# Patient Record
Sex: Female | Born: 1960 | Race: White | Hispanic: No | Marital: Married | Smoking: Never smoker
Health system: Southern US, Community
[De-identification: ages and names within clinical notes are randomized; demographics above are authoritative.]

## PROBLEM LIST (undated history)

## (undated) DIAGNOSIS — Z9289 Personal history of other medical treatment: Secondary | ICD-10-CM

## (undated) DIAGNOSIS — F329 Major depressive disorder, single episode, unspecified: Secondary | ICD-10-CM

## (undated) DIAGNOSIS — K1120 Sialoadenitis, unspecified: Secondary | ICD-10-CM

## (undated) DIAGNOSIS — E785 Hyperlipidemia, unspecified: Secondary | ICD-10-CM

## (undated) DIAGNOSIS — J45909 Unspecified asthma, uncomplicated: Secondary | ICD-10-CM

## (undated) DIAGNOSIS — I10 Essential (primary) hypertension: Secondary | ICD-10-CM

## (undated) DIAGNOSIS — T7840XA Allergy, unspecified, initial encounter: Secondary | ICD-10-CM

## (undated) DIAGNOSIS — G40909 Epilepsy, unspecified, not intractable, without status epilepticus: Secondary | ICD-10-CM

## (undated) DIAGNOSIS — R569 Unspecified convulsions: Secondary | ICD-10-CM

## (undated) DIAGNOSIS — K219 Gastro-esophageal reflux disease without esophagitis: Secondary | ICD-10-CM

## (undated) DIAGNOSIS — C439 Malignant melanoma of skin, unspecified: Secondary | ICD-10-CM

## (undated) DIAGNOSIS — F32A Depression, unspecified: Secondary | ICD-10-CM

## (undated) DIAGNOSIS — E119 Type 2 diabetes mellitus without complications: Secondary | ICD-10-CM

## (undated) DIAGNOSIS — K760 Fatty (change of) liver, not elsewhere classified: Secondary | ICD-10-CM

## (undated) DIAGNOSIS — F419 Anxiety disorder, unspecified: Secondary | ICD-10-CM

## (undated) DIAGNOSIS — E039 Hypothyroidism, unspecified: Secondary | ICD-10-CM

## (undated) DIAGNOSIS — G4730 Sleep apnea, unspecified: Secondary | ICD-10-CM

## (undated) HISTORY — DX: Major depressive disorder, single episode, unspecified: F32.9

## (undated) HISTORY — DX: Depression, unspecified: F32.A

## (undated) HISTORY — DX: Type 2 diabetes mellitus without complications: E11.9

## (undated) HISTORY — PX: LAPAROSCOPIC LYSIS INTESTINAL ADHESIONS: SUR778

## (undated) HISTORY — DX: Unspecified convulsions: R56.9

## (undated) HISTORY — DX: Malignant melanoma of skin, unspecified: C43.9

## (undated) HISTORY — PX: OTHER SURGICAL HISTORY: SHX169

## (undated) HISTORY — DX: Personal history of other medical treatment: Z92.89

## (undated) HISTORY — DX: Essential (primary) hypertension: I10

## (undated) HISTORY — DX: Sialoadenitis, unspecified: K11.20

## (undated) HISTORY — DX: Sleep apnea, unspecified: G47.30

## (undated) HISTORY — DX: Epilepsy, unspecified, not intractable, without status epilepticus: G40.909

## (undated) HISTORY — DX: Hypothyroidism, unspecified: E03.9

## (undated) HISTORY — DX: Unspecified asthma, uncomplicated: J45.909

## (undated) HISTORY — DX: Hyperlipidemia, unspecified: E78.5

## (undated) HISTORY — DX: Gastro-esophageal reflux disease without esophagitis: K21.9

## (undated) HISTORY — DX: Anxiety disorder, unspecified: F41.9

## (undated) HISTORY — DX: Allergy, unspecified, initial encounter: T78.40XA

---

## 1993-09-17 HISTORY — PX: CHOLECYSTECTOMY: SHX55

## 1993-09-17 HISTORY — PX: BILE DUCT EXPLORATION: SHX1225

## 1996-09-17 HISTORY — PX: ABDOMINAL HYSTERECTOMY: SHX81

## 1996-09-17 HISTORY — PX: TUBAL LIGATION: SHX77

## 1997-09-17 HISTORY — PX: LAPAROSCOPIC OOPHERECTOMY: SHX6507

## 2003-06-10 ENCOUNTER — Ambulatory Visit (HOSPITAL_COMMUNITY): Admission: RE | Admit: 2003-06-10 | Discharge: 2003-06-10 | Payer: Self-pay | Admitting: Internal Medicine

## 2003-06-10 ENCOUNTER — Encounter: Payer: Self-pay | Admitting: Internal Medicine

## 2005-07-10 ENCOUNTER — Ambulatory Visit: Payer: Self-pay | Admitting: Internal Medicine

## 2005-09-25 ENCOUNTER — Ambulatory Visit: Payer: Self-pay | Admitting: Internal Medicine

## 2005-09-25 ENCOUNTER — Ambulatory Visit (HOSPITAL_COMMUNITY): Admission: RE | Admit: 2005-09-25 | Discharge: 2005-09-25 | Payer: Self-pay | Admitting: Internal Medicine

## 2005-10-02 ENCOUNTER — Ambulatory Visit: Payer: Self-pay | Admitting: Gastroenterology

## 2005-10-08 ENCOUNTER — Encounter (INDEPENDENT_AMBULATORY_CARE_PROVIDER_SITE_OTHER): Payer: Self-pay | Admitting: *Deleted

## 2005-10-08 ENCOUNTER — Ambulatory Visit: Payer: Self-pay | Admitting: Gastroenterology

## 2005-10-24 ENCOUNTER — Ambulatory Visit: Payer: Self-pay | Admitting: Internal Medicine

## 2005-10-30 ENCOUNTER — Ambulatory Visit: Payer: Self-pay | Admitting: Gastroenterology

## 2005-11-26 ENCOUNTER — Ambulatory Visit: Payer: Self-pay | Admitting: Internal Medicine

## 2005-11-26 ENCOUNTER — Ambulatory Visit: Payer: Self-pay | Admitting: Cardiovascular Disease

## 2005-11-26 ENCOUNTER — Inpatient Hospital Stay (HOSPITAL_COMMUNITY): Admission: EM | Admit: 2005-11-26 | Discharge: 2005-11-27 | Payer: Self-pay | Admitting: Emergency Medicine

## 2005-12-04 ENCOUNTER — Ambulatory Visit: Payer: Self-pay

## 2005-12-04 ENCOUNTER — Ambulatory Visit: Payer: Self-pay | Admitting: Gastroenterology

## 2006-04-02 ENCOUNTER — Ambulatory Visit: Payer: Self-pay | Admitting: Internal Medicine

## 2006-11-12 ENCOUNTER — Ambulatory Visit: Payer: Self-pay | Admitting: Internal Medicine

## 2007-01-08 ENCOUNTER — Ambulatory Visit: Payer: Self-pay | Admitting: Internal Medicine

## 2007-01-08 ENCOUNTER — Ambulatory Visit: Payer: Self-pay | Admitting: Family Medicine

## 2007-01-08 LAB — CONVERTED CEMR LAB
BUN: 10 mg/dL (ref 6–23)
CO2: 31 meq/L (ref 19–32)
Calcium: 9.5 mg/dL (ref 8.4–10.5)
Creatinine, Ser: 0.8 mg/dL (ref 0.4–1.2)
GFR calc non Af Amer: 83 mL/min
Glucose, Bld: 101 mg/dL — ABNORMAL HIGH (ref 70–99)
Sodium: 140 meq/L (ref 135–145)

## 2007-02-06 ENCOUNTER — Ambulatory Visit: Payer: Self-pay | Admitting: Pulmonary Disease

## 2007-03-04 ENCOUNTER — Ambulatory Visit (HOSPITAL_BASED_OUTPATIENT_CLINIC_OR_DEPARTMENT_OTHER): Admission: RE | Admit: 2007-03-04 | Discharge: 2007-03-04 | Payer: Self-pay | Admitting: Pulmonary Disease

## 2007-03-17 ENCOUNTER — Ambulatory Visit: Payer: Self-pay | Admitting: Pulmonary Disease

## 2007-03-19 ENCOUNTER — Ambulatory Visit: Payer: Self-pay | Admitting: Pulmonary Disease

## 2007-05-22 ENCOUNTER — Encounter: Payer: Self-pay | Admitting: Internal Medicine

## 2007-05-22 DIAGNOSIS — F3289 Other specified depressive episodes: Secondary | ICD-10-CM | POA: Insufficient documentation

## 2007-05-22 DIAGNOSIS — R569 Unspecified convulsions: Secondary | ICD-10-CM | POA: Insufficient documentation

## 2007-05-22 DIAGNOSIS — F1010 Alcohol abuse, uncomplicated: Secondary | ICD-10-CM | POA: Insufficient documentation

## 2007-05-22 DIAGNOSIS — F39 Unspecified mood [affective] disorder: Secondary | ICD-10-CM | POA: Insufficient documentation

## 2007-05-22 DIAGNOSIS — J309 Allergic rhinitis, unspecified: Secondary | ICD-10-CM | POA: Insufficient documentation

## 2007-05-22 DIAGNOSIS — F329 Major depressive disorder, single episode, unspecified: Secondary | ICD-10-CM

## 2007-05-22 DIAGNOSIS — J45909 Unspecified asthma, uncomplicated: Secondary | ICD-10-CM | POA: Insufficient documentation

## 2007-05-22 DIAGNOSIS — J3089 Other allergic rhinitis: Secondary | ICD-10-CM | POA: Insufficient documentation

## 2007-05-26 DIAGNOSIS — D649 Anemia, unspecified: Secondary | ICD-10-CM | POA: Insufficient documentation

## 2007-06-05 ENCOUNTER — Ambulatory Visit: Payer: Self-pay | Admitting: Pulmonary Disease

## 2007-06-16 ENCOUNTER — Ambulatory Visit: Payer: Self-pay | Admitting: Internal Medicine

## 2007-06-16 LAB — CONVERTED CEMR LAB
Basophils Absolute: 0.1 10*3/uL (ref 0.0–0.1)
Basophils Relative: 0.4 % (ref 0.0–1.0)
Calcium: 9.6 mg/dL (ref 8.4–10.5)
Chloride: 106 meq/L (ref 96–112)
Direct LDL: 108 mg/dL
Eosinophils Relative: 4.2 % (ref 0.0–5.0)
GFR calc Af Amer: 117 mL/min
GFR calc non Af Amer: 97 mL/min
HDL: 33 mg/dL — ABNORMAL LOW (ref >39.0)
Hemoglobin: 11.5 g/dL — ABNORMAL LOW (ref 12.0–15.0)
Lymphocytes Relative: 34.1 % (ref 12.0–46.0)
MCHC: 34.1 g/dL (ref 30.0–36.0)
Monocytes Absolute: 0.5 10*3/uL (ref 0.2–0.7)
Neutrophils Relative %: 55.6 % (ref 43.0–77.0)
Platelets: 303 10*3/uL (ref 150–400)
Potassium: 4.5 meq/L (ref 3.5–5.1)
RDW: 11.5 % (ref 11.5–14.6)
Sodium: 142 meq/L (ref 135–145)
TSH: 3.84 microintl units/mL (ref 0.35–5.50)
Total Bilirubin: 0.6 mg/dL (ref 0.3–1.2)
Total CHOL/HDL Ratio: 5
Total Protein: 6.3 g/dL (ref 6.0–8.3)
Triglycerides: 215 mg/dL (ref 0–149)
Urine Glucose: NEGATIVE mg/dL
VLDL: 43 mg/dL — ABNORMAL HIGH (ref 0–40)
WBC: 9.1 10*3/uL (ref 4.5–10.5)

## 2007-08-08 ENCOUNTER — Ambulatory Visit: Payer: Self-pay | Admitting: Gastroenterology

## 2007-08-11 ENCOUNTER — Ambulatory Visit: Payer: Self-pay | Admitting: Gastroenterology

## 2007-08-11 ENCOUNTER — Encounter: Payer: Self-pay | Admitting: Gastroenterology

## 2007-08-25 ENCOUNTER — Ambulatory Visit: Payer: Self-pay | Admitting: Internal Medicine

## 2007-08-25 DIAGNOSIS — E785 Hyperlipidemia, unspecified: Secondary | ICD-10-CM

## 2007-08-25 DIAGNOSIS — R7309 Other abnormal glucose: Secondary | ICD-10-CM | POA: Insufficient documentation

## 2007-08-25 DIAGNOSIS — E1169 Type 2 diabetes mellitus with other specified complication: Secondary | ICD-10-CM | POA: Insufficient documentation

## 2007-09-19 ENCOUNTER — Ambulatory Visit: Payer: Self-pay | Admitting: Internal Medicine

## 2007-09-19 DIAGNOSIS — D239 Other benign neoplasm of skin, unspecified: Secondary | ICD-10-CM | POA: Insufficient documentation

## 2007-09-19 DIAGNOSIS — I10 Essential (primary) hypertension: Secondary | ICD-10-CM

## 2007-09-19 DIAGNOSIS — I152 Hypertension secondary to endocrine disorders: Secondary | ICD-10-CM | POA: Insufficient documentation

## 2007-09-19 DIAGNOSIS — E1159 Type 2 diabetes mellitus with other circulatory complications: Secondary | ICD-10-CM

## 2007-09-30 ENCOUNTER — Ambulatory Visit: Payer: Self-pay | Admitting: Internal Medicine

## 2007-09-30 LAB — CONVERTED CEMR LAB
ALT: 27 units/L (ref 0–35)
AST: 21 units/L (ref 0–37)
Cholesterol: 198 mg/dL (ref 0–200)
Folate: >20 ng/mL
Iron: 68 ug/dL (ref 42–145)
Total CHOL/HDL Ratio: 5
Transferrin: 243.7 mg/dL (ref >=212.0)
Vitamin B-12: 551 pg/mL (ref 211–911)

## 2007-10-06 LAB — CONVERTED CEMR LAB

## 2007-10-13 ENCOUNTER — Encounter: Admission: RE | Admit: 2007-10-13 | Discharge: 2007-10-13 | Payer: Self-pay | Admitting: Obstetrics and Gynecology

## 2007-10-13 ENCOUNTER — Encounter: Payer: Self-pay | Admitting: Internal Medicine

## 2007-10-27 ENCOUNTER — Ambulatory Visit: Payer: Self-pay | Admitting: Internal Medicine

## 2007-10-27 DIAGNOSIS — F4322 Adjustment disorder with anxiety: Secondary | ICD-10-CM | POA: Insufficient documentation

## 2007-11-03 ENCOUNTER — Ambulatory Visit: Payer: Self-pay | Admitting: Internal Medicine

## 2007-11-03 ENCOUNTER — Telehealth: Payer: Self-pay | Admitting: Internal Medicine

## 2007-11-03 ENCOUNTER — Ambulatory Visit: Payer: Self-pay | Admitting: Cardiovascular Disease

## 2007-11-03 DIAGNOSIS — K1120 Sialoadenitis, unspecified: Secondary | ICD-10-CM | POA: Insufficient documentation

## 2007-11-05 ENCOUNTER — Ambulatory Visit: Payer: Self-pay | Admitting: Internal Medicine

## 2007-11-05 DIAGNOSIS — C792 Secondary malignant neoplasm of skin: Secondary | ICD-10-CM | POA: Insufficient documentation

## 2007-11-05 DIAGNOSIS — R55 Syncope and collapse: Secondary | ICD-10-CM | POA: Insufficient documentation

## 2007-11-06 ENCOUNTER — Encounter: Payer: Self-pay | Admitting: Internal Medicine

## 2007-11-06 LAB — CONVERTED CEMR LAB
BUN: 9 mg/dL (ref 6–23)
Basophils Absolute: 0.1 10*3/uL (ref 0.0–0.1)
CO2: 32 meq/L (ref 19–32)
Chloride: 101 meq/L (ref 96–112)
Eosinophils Absolute: 0.4 10*3/uL (ref 0.0–0.6)
Eosinophils Relative: 3.2 % (ref 0.0–5.0)
GFR calc Af Amer: 100 mL/min
Glucose, Bld: 95 mg/dL (ref 70–99)
Hemoglobin: 12.6 g/dL (ref 12.0–15.0)
MCHC: 34 g/dL (ref 30.0–36.0)
Neutro Abs: 7.3 10*3/uL (ref 1.4–7.7)
Neutrophils Relative %: 64.8 % (ref 43.0–77.0)
Platelets: 292 10*3/uL (ref 150–400)
Potassium: 4 meq/L (ref 3.5–5.1)
RBC: 4.09 M/uL (ref 3.87–5.11)

## 2007-11-20 ENCOUNTER — Ambulatory Visit: Payer: Self-pay | Admitting: Internal Medicine

## 2007-12-02 ENCOUNTER — Telehealth: Payer: Self-pay | Admitting: Internal Medicine

## 2007-12-19 ENCOUNTER — Ambulatory Visit (HOSPITAL_BASED_OUTPATIENT_CLINIC_OR_DEPARTMENT_OTHER): Admission: RE | Admit: 2007-12-19 | Discharge: 2007-12-19 | Payer: Self-pay | Admitting: Otolaryngology

## 2008-02-19 ENCOUNTER — Emergency Department (HOSPITAL_COMMUNITY): Admission: EM | Admit: 2008-02-19 | Discharge: 2008-02-20 | Payer: Self-pay | Admitting: Emergency Medicine

## 2008-03-25 ENCOUNTER — Ambulatory Visit: Payer: Self-pay | Admitting: Internal Medicine

## 2008-03-25 LAB — CONVERTED CEMR LAB
ALT: 19 units/L (ref 0–35)
Cholesterol: 125 mg/dL (ref 0–200)
HDL: 38.6 mg/dL — ABNORMAL LOW (ref >39.0)
LDL Cholesterol: 59 mg/dL (ref 0–99)

## 2008-04-23 ENCOUNTER — Ambulatory Visit: Payer: Self-pay | Admitting: Internal Medicine

## 2008-05-12 ENCOUNTER — Encounter: Payer: Self-pay | Admitting: Internal Medicine

## 2008-05-14 ENCOUNTER — Encounter: Payer: Self-pay | Admitting: Internal Medicine

## 2008-05-18 ENCOUNTER — Encounter: Payer: Self-pay | Admitting: Internal Medicine

## 2008-07-27 ENCOUNTER — Telehealth (INDEPENDENT_AMBULATORY_CARE_PROVIDER_SITE_OTHER): Payer: Self-pay | Admitting: *Deleted

## 2008-11-05 ENCOUNTER — Encounter: Admission: RE | Admit: 2008-11-05 | Discharge: 2008-11-05 | Payer: Self-pay | Admitting: Internal Medicine

## 2008-11-09 ENCOUNTER — Telehealth (INDEPENDENT_AMBULATORY_CARE_PROVIDER_SITE_OTHER): Payer: Self-pay | Admitting: *Deleted

## 2008-12-02 ENCOUNTER — Ambulatory Visit: Payer: Self-pay | Admitting: Internal Medicine

## 2008-12-02 LAB — CONVERTED CEMR LAB
BUN: 13 mg/dL (ref 6–23)
CO2: 32 meq/L (ref 19–32)
Chloride: 102 meq/L (ref 96–112)
Creatinine, Ser: 0.8 mg/dL (ref 0.4–1.2)
GFR calc non Af Amer: 82.3 mL/min (ref >60)

## 2008-12-06 ENCOUNTER — Ambulatory Visit: Payer: Self-pay | Admitting: Internal Medicine

## 2008-12-06 LAB — CONVERTED CEMR LAB: Hgb A1c MFr Bld: 6.2 % (ref 4.6–6.5)

## 2008-12-07 ENCOUNTER — Telehealth (INDEPENDENT_AMBULATORY_CARE_PROVIDER_SITE_OTHER): Payer: Self-pay | Admitting: *Deleted

## 2008-12-27 ENCOUNTER — Ambulatory Visit: Payer: Self-pay | Admitting: Internal Medicine

## 2008-12-28 ENCOUNTER — Encounter: Payer: Self-pay | Admitting: Internal Medicine

## 2009-01-06 ENCOUNTER — Encounter: Payer: Self-pay | Admitting: Internal Medicine

## 2009-01-10 ENCOUNTER — Encounter: Payer: Self-pay | Admitting: Internal Medicine

## 2009-01-10 ENCOUNTER — Telehealth: Payer: Self-pay | Admitting: Internal Medicine

## 2009-02-07 ENCOUNTER — Encounter: Payer: Self-pay | Admitting: Internal Medicine

## 2009-02-07 ENCOUNTER — Ambulatory Visit: Payer: Self-pay | Admitting: Internal Medicine

## 2009-03-15 ENCOUNTER — Ambulatory Visit: Payer: Self-pay | Admitting: Internal Medicine

## 2009-06-09 ENCOUNTER — Telehealth: Payer: Self-pay | Admitting: Internal Medicine

## 2009-06-13 ENCOUNTER — Telehealth: Payer: Self-pay | Admitting: Internal Medicine

## 2009-06-20 ENCOUNTER — Telehealth: Payer: Self-pay | Admitting: Internal Medicine

## 2009-06-20 ENCOUNTER — Encounter: Payer: Self-pay | Admitting: Internal Medicine

## 2009-06-21 ENCOUNTER — Encounter: Payer: Self-pay | Admitting: Internal Medicine

## 2009-07-01 ENCOUNTER — Encounter: Payer: Self-pay | Admitting: Internal Medicine

## 2009-07-06 ENCOUNTER — Encounter: Payer: Self-pay | Admitting: Internal Medicine

## 2009-08-02 ENCOUNTER — Ambulatory Visit: Payer: Self-pay | Admitting: Internal Medicine

## 2009-08-02 LAB — CONVERTED CEMR LAB
BUN: 10 mg/dL (ref 6–23)
CO2: 31 meq/L (ref 19–32)
Calcium: 9 mg/dL (ref 8.4–10.5)
Creatinine, Ser: 0.8 mg/dL (ref 0.4–1.2)
GFR calc non Af Amer: 82.06 mL/min (ref >60)
Potassium: 3.7 meq/L (ref 3.5–5.1)

## 2009-10-17 ENCOUNTER — Telehealth: Payer: Self-pay | Admitting: Internal Medicine

## 2009-12-15 ENCOUNTER — Telehealth: Payer: Self-pay | Admitting: Internal Medicine

## 2010-01-02 ENCOUNTER — Ambulatory Visit: Payer: Self-pay | Admitting: Internal Medicine

## 2010-01-02 DIAGNOSIS — G4700 Insomnia, unspecified: Secondary | ICD-10-CM | POA: Insufficient documentation

## 2010-01-02 DIAGNOSIS — R609 Edema, unspecified: Secondary | ICD-10-CM | POA: Insufficient documentation

## 2010-01-02 LAB — CONVERTED CEMR LAB
ALT: 12 units/L (ref 0–35)
BUN: 13 mg/dL (ref 6–23)
CO2: 26 meq/L (ref 19–32)
Calcium: 9.7 mg/dL (ref 8.4–10.5)
Cholesterol: 125 mg/dL (ref 0–200)
Creatinine, Ser: 0.79 mg/dL (ref 0.40–1.20)
HDL: 49 mg/dL (ref >39)
Hgb A1c MFr Bld: 6 % — ABNORMAL HIGH (ref <5.7)
Pro B Natriuretic peptide (BNP): 4.2 pg/mL (ref 0.0–100.0)
Total CHOL/HDL Ratio: 2.6
Triglycerides: 118 mg/dL (ref <150)

## 2010-01-03 ENCOUNTER — Encounter: Payer: Self-pay | Admitting: Internal Medicine

## 2010-01-30 ENCOUNTER — Encounter: Payer: Self-pay | Admitting: Internal Medicine

## 2010-03-17 ENCOUNTER — Telehealth: Payer: Self-pay | Admitting: Internal Medicine

## 2010-03-22 ENCOUNTER — Encounter: Admission: RE | Admit: 2010-03-22 | Discharge: 2010-03-22 | Payer: Self-pay | Admitting: Internal Medicine

## 2010-03-27 ENCOUNTER — Encounter: Payer: Self-pay | Admitting: Internal Medicine

## 2010-07-21 ENCOUNTER — Telehealth: Payer: Self-pay | Admitting: Internal Medicine

## 2010-09-08 ENCOUNTER — Ambulatory Visit: Payer: Self-pay | Admitting: Internal Medicine

## 2010-09-20 ENCOUNTER — Telehealth: Payer: Self-pay | Admitting: Internal Medicine

## 2010-10-15 LAB — CONVERTED CEMR LAB
Calcium, Total (PTH): 9.9 mg/dL (ref 8.4–10.5)
PTH: 35.4 pg/mL (ref 14.0–72.0)

## 2010-10-17 NOTE — Progress Notes (Signed)
Summary: Lunesta  Phone Note Refill Request Message from:  Fax from Pharmacy on December 15, 2009 2:35 PM  Refills Requested: Medication #1:  LUNESTA 2 MG  TABS one by mouth at bedtime prn   Dosage confirmed as above?Dosage Confirmed   Brand Name Necessary? No   Supply Requested: 1 month [facility]  [address] FAX [phone]   Method Requested: Electronic Next Appointment Scheduled: NONE Initial call taken by: Roselle Locus,  December 15, 2009 2:36 PM  Follow-up for Phone Call        FYI- patient has cancelled last 7 office visits Follow-up by: Glendell Docker CMA,  December 15, 2009 2:56 PM  Additional Follow-up for Phone Call Additional follow up Details #1::        refill x 1 only.  please let her know she needs to make f/u appt Additional Follow-up by: D. Thomos Lemons DO,  December 16, 2009 8:16 AM    Additional Follow-up for Phone Call Additional follow up Details #2::    Rx called to pharmacy, attempted to contact patient at [phone], voice recording stating the number has been disconnected, call placed to patient at [phone], no answer, detailed voice message left informing patient per Dr Artist Pais instructions Follow-up by: Glendell Docker CMA,  December 16, 2009 1:46 PM  New/Updated Medications: LUNESTA 2 MG  TABS (ESZOPICLONE) Take 1 tab by mouth at bedtime as needed (OFFICE VISIT IS NEEDED FOR REFILLS) Prescriptions: LUNESTA 2 MG  TABS (ESZOPICLONE) Take 1 tab by mouth at bedtime as needed (OFFICE VISIT IS NEEDED FOR REFILLS)  #30 x 0   Entered by:   Glendell Docker CMA   Authorized by:   D. Thomos Lemons DO   Signed by:   Glendell Docker CMA on 12/16/2009   Method used:   Telephoned to ...       [facility]  [address] [phone]* (retail)       [address]       [place], [place]  [address]       Ph: [phone]       Fax: [phone]   RxID:   [id number]

## 2010-10-17 NOTE — Progress Notes (Signed)
Summary: [facility] Refills  Phone Note Call from Patient Call back at [facility] Phone [phone]   Caller: Patient Reason for Call: Refill Medication Summary of Call: patient sent fax requesting new rx's sent to [facility] for her medications. The fax requests states she has new insurance that will become effective 03/17/2010. Initial call taken by: Glendell Docker CMA,  March 17, 2010 9:09 AM  Follow-up for Phone Call        Rx sent to [facility]  Follow-up by: Glendell Docker CMA,  March 17, 2010 9:13 AM    Prescriptions: SINGULAIR 10 MG  TABS (MONTELUKAST SODIUM) Take 1 tablet by mouth once a day as needed  #90 x 2   Entered by:   Glendell Docker CMA   Authorized by:   D. Thomos Lemons DO   Signed by:   Glendell Docker CMA on 03/17/2010   Method used:   Faxed to ...       [facility] (mail-order)             , [place]         Ph: [phone]       Fax: [phone]   RxID:   [id number] METFORMIN HCL 500 MG XR24H-TAB (METFORMIN HCL) one by mouth two times a day  #180 x 2   Entered by:   Glendell Docker CMA   Authorized by:   D. Thomos Lemons DO   Signed by:   Glendell Docker CMA on 03/17/2010   Method used:   Faxed to ...       [facility] (mail-order)             , [place]         Ph: [phone]       Fax: [phone]   RxID:   [id number] ESTRACE 1 MG  TABS (ESTRADIOL) 1 1/2 tablet by mouth once daily  #180 x 2   Entered by:   Glendell Docker CMA   Authorized by:   D. Thomos Lemons DO   Signed by:   Glendell Docker CMA on 03/17/2010   Method used:   Faxed to ...       [facility] (mail-order)             , [place]         Ph: [phone]       Fax: [phone]   RxID:   [id number] CRESTOR 10 MG  TABS (ROSUVASTATIN CALCIUM) Take 1 tablet by mouth once a day  #90 x 2   Entered by:   Glendell Docker CMA   Authorized by:   D. Thomos Lemons DO   Signed by:   Glendell Docker CMA on 03/17/2010   Method used:   Faxed to ...       [facility] (mail-order)             , [place]         Ph: [phone]       Fax: [phone]  RxID:   [id number] HYDROCHLOROTHIAZIDE 12.5 MG  TABS (HYDROCHLOROTHIAZIDE) Take 1 tablet by mouth once a day  #90 x 2   Entered by:   Glendell Docker CMA   Authorized by:   D. Thomos Lemons DO   Signed by:   Glendell Docker CMA on 03/17/2010   Method used:   Faxed to ...       [facility] (mail-order)             , [place]  Ph: [phone]       Fax: [phone]   RxID:   [id number]

## 2010-10-17 NOTE — Letter (Signed)
   [place] [facility] [address] [place], [place]  [address]  [place] Phone: [phone]      Jan 16, 2010   SANGITA ZANI [address] [place], [place] [address]  RE:  LAB RESULTS  Dear  Ms. Chronister,  The following is an interpretation of your most recent lab tests.  Please take note of any instructions provided or changes to medications that have resulted from your lab work.  ELECTROLYTES:  Good - no changes needed  KIDNEY FUNCTION TESTS:  Good - no changes needed  LIVER FUNCTION TESTS:  Good - no changes needed  LIPID PANEL:  Good - no changes needed Triglyceride: 118   Cholesterol: 125   LDL: 52   HDL: 49   Chol/HDL%:  2.6 Ratio   DIABETIC STUDIES:  Excellent - no changes needed Blood Glucose: 103   HgbA1C: 6.0          Sincerely Yours,    Dr. Thomos Lemons

## 2010-10-17 NOTE — Progress Notes (Signed)
Summary: Singulair  Phone Note Refill Request Message from:  Fax from Pharmacy on October 17, 2009 2:04 PM  Refills Requested: Medication #1:  SINGULAIR 10 MG  TABS Take 1 tablet by mouth once a day as needed   Dosage confirmed as above?Dosage Confirmed   Brand Name Necessary? No   Supply Requested: 3 months   Last Refilled: 07/16/2009  Method Requested: Electronic Next Appointment Scheduled: none Initial call taken by: Roselle Locus,  October 17, 2009 2:04 PM    Prescriptions: SINGULAIR 10 MG  TABS (MONTELUKAST SODIUM) Take 1 tablet by mouth once a day as needed  #90 x 3   Entered by:   Glendell Docker CMA   Authorized by:   D. Thomos Lemons DO   Signed by:   Glendell Docker CMA on 10/17/2009   Method used:   Electronically to        [id number] (retail)       [address]       [place], [place]  [address]       Ph: [phone]       Fax: [phone]   RxID:   [id number]

## 2010-10-17 NOTE — Medication Information (Signed)
Summary: Care Consideration Regarding Diabetes & Hypertension & ACE Inhib  Care Consideration Regarding Diabetes & Hypertension & ACE Inhibitor/Active Health Mgmt   Imported By: Lanelle Bal 02/20/2010 12:03:02  _____________________________________________________________________  External Attachment:    Type:   Image     Comment:   External Document

## 2010-10-17 NOTE — Progress Notes (Signed)
Summary: refill lunesta  Phone Note Refill Request Message from:  Fax from Pharmacy on July 21, 2010 1:17 PM  Refills Requested: Medication #1:  LUNESTA 2 MG  TABS Take 1 tab by mouth at bedtime as needed   Dosage confirmed as above?Dosage Confirmed   Brand Name Necessary? No   Supply Requested: 1 month   [facility] [address] [place] [address] [phone] fax [phone]   Method Requested: Electronic Next Appointment Scheduled: 08-07-10 Dr Artist Pais  Initial call taken by: Roselle Locus,  July 21, 2010 1:17 PM  Follow-up for Phone Call        Left refill on pharmacy voicemail. Nicki Guadalajara Fergerson CMA (AAMA)  July 21, 2010 3:21 PM     Prescriptions: LUNESTA 2 MG  TABS (ESZOPICLONE) Take 1 tab by mouth at bedtime as needed  #30 x 0   Entered by:   Mervin Kung CMA (AAMA)   Authorized by:   D. Thomos Lemons DO   Signed by:   Mervin Kung CMA (AAMA) on 07/21/2010   Method used:   [facility]ph[facility] ...       [facility]  [address] [phone][facility] ([facility]ail)       [address]       [place], [place]  [address]       Ph: [phone] or [phone]       Fax: [phone]   RxID:   [id number]

## 2010-10-17 NOTE — Assessment & Plan Note (Signed)
Summary: med refill   Vital Signs:  Patient profile:   50 year old female Height:      63 inches Weight:      211 pounds BMI:     37.51 Temp:     98.2 degrees F oral Pulse rate:   72 / minute Pulse rhythm:   regular Resp:     16 per minute BP sitting:   114 / 80  (right arm) Cuff size:   large  Vitals Entered By: Glendell Docker CMA (January 02, 2010 8:34 AM) CC: Rm 2- medication refill     Last PAP Result Deferred- Hysterectomy   Primary Care Provider:  Dondra Spry DO  CC:  Rm 2- medication refill.  History of Present Illness: 50 year old white female for routine followup son (wrestler in HS, very fit)  helping with her with exercises and wt loss eating when she is [place] - usually eats 3 x per day.  2 main meals - breakfast and lunch less stress eating she is looking for new job - current boss hard to work with  c/o fluid retention, and dizziness off and on -concerned that her blood pressure has been low   Chronic insomnia - no change. Using Lunesta as directed no side effects noted  Allergies: 1)  ! Morphine 2)  ! * Flu Vaccination 3)  ! * Citrus Products  Past History:  Past Medical History: Allergic rhinitis Depression Hyperlipidemia   Hypertension Seizure disorder   Atypical chest pain SIALADENITIS - seen by Dr. Ezzard Standing in the past. Daughter with autism  Past Surgical History: Hysterectomy Cholecystectomy  Tubal ligation     Social History: Occupation:  Best boy for [employer] Married -  Lives with her husband and children  Never Smoked  Alcohol use-no     Physical Exam  General:  alert and overweight-appearing.   Eyes:  pupils equal, pupils round, and pupils reactive to light.   Lungs:  normal respiratory effort and normal breath sounds.   Heart:  normal rate, regular rhythm, and no gallop.   Extremities:  trace left pedal edema and trace right pedal edema.     Impression & Recommendations:  Problem # 1:  OTHER ABNORMAL GLUCOSE  (ICD-790.29)  patient reports metformin helps with appetite control.  continue same dose Her updated medication list for this problem includes:    Metformin Hcl 500 Mg Xr24h-tab (Metformin hcl) ..... One by mouth two times a day  Orders: T- Hemoglobin A1C ([id number])  Labs Reviewed: Creat: 0.8 (08/02/2009)     Last Eye Exam: normal (01/10/2009)  Problem # 2:  HYPERLIPIDEMIA (ICD-272.4) tolerating statin.  monitor labs  Her updated medication list for this problem includes:    Crestor 10 Mg Tabs (Rosuvastatin calcium) .Marland Kitchen... Take 1 tablet by mouth once a day  Orders: T- * Misc. Laboratory test [phone]) T-Lipid Profile [phone])  Labs Reviewed: SGOT: 20 (03/25/2008)   SGPT: 19 (03/25/2008)   HDL:38.6 (03/25/2008), 39.5 (09/30/2007)  LDL:59 (03/25/2008), 134 (96/29/5284)  Chol:125 (03/25/2008), 198 (09/30/2007)  Trig:136 (03/25/2008), 125 (09/30/2007)  Problem # 3:  HYPERTENSION (ICD-401.9) patient reports lower extremity edema. She understands importance of low sodium diet.    Her updated medication list for this problem includes:    Hydrochlorothiazide 12.5 Mg Tabs (Hydrochlorothiazide) .Marland Kitchen... Take 1 tablet by mouth once a day  Orders: T-Basic Metabolic Panel ([id number])  BP today: 114/80 Prior BP: 138/90 (03/15/2009)  Labs Reviewed: K+: 3.7 (08/02/2009) Creat: : 0.8 (08/02/2009)   Chol: 125 (03/25/2008)  HDL: 38.6 (03/25/2008)   LDL: 59 (03/25/2008)   TG: 136 (03/25/2008)  Problem # 4:  INSOMNIA, CHRONIC (ICD-307.42) Assessment: Unchanged continue lunesta as needed  Complete Medication List: 1)  Singulair 10 Mg Tabs (Montelukast sodium) .... Take 1 tablet by mouth once a day as needed 2)  Estrace 1 Mg Tabs (Estradiol) .Marland Kitchen.. 1 1/2 tablet by mouth once daily 3)  Crestor 10 Mg Tabs (Rosuvastatin calcium) .... Take 1 tablet by mouth once a day 4)  Hydrochlorothiazide 12.5 Mg Tabs (Hydrochlorothiazide) .... Take 1 tablet by mouth once a day 5)  Lunesta 2 Mg Tabs  (Eszopiclone) .... Take 1 tab by mouth at bedtime as needed (office visit is needed for refills) 6)  Metformin Hcl 500 Mg Xr24h-tab (Metformin hcl) .... One by mouth two times a day  Other Orders: T-BNP  (B Natriuretic Peptide) ([id number])  Patient Instructions: 1)  Please schedule a follow-up appointment in 6 months. Prescriptions: HYDROCHLOROTHIAZIDE 12.5 MG  TABS (HYDROCHLOROTHIAZIDE) Take 1 tablet by mouth once a day  #90 x 3   Entered and Authorized by:   D. Thomos Lemons DO   Signed by:   D. Thomos Lemons DO on 01/02/2010   Method used:   Electronically to        [id number]* (retail)       [address]       [place], [place]  [address]       Ph: [phone]       Fax: [phone]   RxID:   [id number] CRESTOR 10 MG  TABS (ROSUVASTATIN CALCIUM) Take 1 tablet by mouth once a day  #90 x 3   Entered and Authorized by:   D. Thomos Lemons DO   Signed by:   D. Thomos Lemons DO on 01/02/2010   Method used:   Electronically to        [id number]* (retail)       [address]       [place], [place]  [address]       Ph: [phone]       Fax: [phone]   RxID:   [id number] ESTRACE 1 MG  TABS (ESTRADIOL) 1 1/2 tablet by mouth once daily  #180 x 3   Entered and Authorized by:   D. Thomos Lemons DO   Signed by:   D. Thomos Lemons DO on 01/02/2010   Method used:   Electronically to        [id number]* (retail)       [address]       [place], [place]  [address]       Ph: [phone]       Fax: [phone]   RxID:   [id number] SINGULAIR 10 MG  TABS (MONTELUKAST SODIUM) Take 1 tablet by mouth once a day as needed  #90 x 3   Entered and Authorized by:   D. Thomos Lemons DO   Signed by:   D. Thomos Lemons DO on 01/02/2010   Method used:   Electronically to        [id number]* (retail)       [address]       [place], [place]  [address]        Ph: [phone]  Fax: [phone]   RxID:   [id number] LUNESTA 2 MG  TABS (ESZOPICLONE) Take 1 tab by mouth at bedtime as needed (OFFICE VISIT IS NEEDED FOR REFILLS)  #30 x 5   Entered and Authorized by:   D. Thomos Lemons DO   Signed by:   D. Thomos Lemons DO on 01/02/2010   Method used:   Print then Give to Patient   RxID:   [id number] METFORMIN HCL 500 MG XR24H-TAB (METFORMIN HCL) one by mouth two times a day  #180 x 3   Entered and Authorized by:   D. Thomos Lemons DO   Signed by:   D. Thomos Lemons DO on 01/02/2010   Method used:   Electronically to        [id number]* (retail)       [address]       [place], [place]  [address]       Ph: [phone]       Fax: [phone]   RxID:   [id number]   Current Allergies (reviewed today): ! MORPHINE ! * FLU VACCINATION ! * CITRUS PRODUCTS   Preventive Care Screening  Pap Smear:    Date:  01/02/2010    Results:  Deferred- Hysterectomy

## 2010-10-17 NOTE — Progress Notes (Signed)
Summary: 1 month supply till mailorder comes   Phone Note Refill Request Call back at [facility] Phone [phone]   Refills Requested: Medication #1:  LUNESTA 2 MG  TABS Take 1 tab by mouth at bedtime as needed (OFFICE VISIT IS NEEDED FOR REFILLS)   Dosage confirmed as above?Dosage Confirmed   Brand Name Necessary? No   Supply Requested: 1 month  Medication #2:  METFORMIN HCL 500 MG XR24H-TAB one by mouth two times a day.   Dosage confirmed as above?Dosage Confirmed   Brand Name Necessary? No   Supply Requested: 1 month she will need a one month supply till her mail order rx's come.  [facility] [address] [place]    Method Requested: Electronic Next Appointment Scheduled: 07-07-10 8:15 Dr Artist Pais  Initial call taken by: Roselle Locus,  March 17, 2010 11:04 AM  Follow-up for Phone Call        ok for month refill at local pharm Follow-up by: D. Thomos Lemons DO,  March 17, 2010 12:08 PM  Additional Follow-up for Phone Call Additional follow up Details #1::        Rx called to pharmacy Additional Follow-up by: Glendell Docker CMA,  March 17, 2010 12:13 PM    New/Updated Medications: LUNESTA 2 MG  TABS (ESZOPICLONE) Take 1 tab by mouth at bedtime as needed Prescriptions: LUNESTA 2 MG  TABS (ESZOPICLONE) Take 1 tab by mouth at bedtime as needed  #30 x 0   Entered by:   Glendell Docker CMA   Authorized by:   D. Thomos Lemons DO   Signed by:   Glendell Docker CMA on 03/17/2010   Method used:   [facility]phoned to ...       [facility]  [address] [phone]* (retail)       [address]       [place], [place]  [address]       Ph: [phone] or [phone]       Fax: [phone]   RxID:   [id number] METFORMIN HCL 500 MG XR24H-TAB (METFORMIN HCL) one by mouth two times a day  #60 x 0   Entered by:   Glendell Docker CMA   Authorized by:   D. Thomos Lemons DO   Signed by:   Glendell Docker CMA on 03/17/2010   Method used:    Telephoned to ...       [facility]  [address] [phone]* (retail)       [address]       [place], [place]  [address]       Ph: [phone] or [phone]       Fax: [phone]   RxID:   [id number]

## 2010-10-17 NOTE — Letter (Signed)
Summary: Geralynn Rile  [facility]   Imported By: Lanelle Bal 04/04/2010 09:03:13  _____________________________________________________________________  External Attachment:    Type:   Image     Comment:   External Document

## 2010-10-17 NOTE — Miscellaneous (Signed)
Summary: Eye Exam   Clinical Lists Changes  Observations: Added new observation of DMEYEEXAMNXT: 03/2011 (03/27/2010 16:30) Added new observation of DMEYEEXMRES: normal (03/27/2010 16:30) Added new observation of EYE EXAM BY: [facility] (03/27/2010 16:30) Added new observation of DIAB EYE EX: normal (03/27/2010 16:30)       Diabetes Management Exam:    Eye Exam:       Eye Exam done elsewhere          Date: 03/27/2010          Results: normal          Done by: [facility]

## 2010-10-19 NOTE — Progress Notes (Signed)
Summary: Alfonso Patten Refill  Phone Note Refill Request Message from:  Fax from Pharmacy on September 20, 2010 8:53 AM  Refills Requested: Medication #1:  LUNESTA 2 MG  TABS Take 1 tab by mouth at bedtime as needed   Dosage confirmed as above?Dosage Confirmed   Brand Name Necessary? No   Supply Requested: 1 month   [facility] [address] [place] [address] fax [phone]   Method Requested: Electronic Next Appointment Scheduled: 10-02-10 Dr Artist Pais  Initial call taken by: Roselle Locus,  September 20, 2010 8:53 AM  Follow-up for Phone Call        ok to refill x 3 Follow-up by: D. Thomos Lemons DO,  September 20, 2010 12:38 PM  Additional Follow-up for Phone Call Additional follow up Details #1::        Rx called to pharmacy  to [facility].  Additional Follow-up by: Glendell Docker CMA,  September 20, 2010 1:43 PM    Prescriptions: LUNESTA 2 MG  TABS (ESZOPICLONE) Take 1 tab by mouth at bedtime as needed  #30 x 3   Entered by:   Glendell Docker CMA   Authorized by:   D. Thomos Lemons DO   Signed by:   Glendell Docker CMA on 09/20/2010   Method used:   Telephoned to ...       [facility]  [address] [phone]* (retail)       [address]       [place], [place]  [address]       Ph: [phone] or [phone]       Fax: [phone]   RxID:   [id number]

## 2010-12-10 ENCOUNTER — Other Ambulatory Visit: Payer: Self-pay | Admitting: Internal Medicine

## 2010-12-10 DIAGNOSIS — R7309 Other abnormal glucose: Secondary | ICD-10-CM

## 2010-12-10 DIAGNOSIS — I10 Essential (primary) hypertension: Secondary | ICD-10-CM

## 2010-12-10 DIAGNOSIS — E785 Hyperlipidemia, unspecified: Secondary | ICD-10-CM

## 2010-12-15 NOTE — Telephone Encounter (Signed)
rx refills sent to [facility]

## 2010-12-18 MED ORDER — ROSUVASTATIN CALCIUM 10 MG PO TABS
10.0000 mg | ORAL_TABLET | Freq: Every day | ORAL | Status: DC
Start: 2010-12-18 — End: 2018-07-07

## 2010-12-18 MED ORDER — HYDROCHLOROTHIAZIDE 12.5 MG PO TABS
12.5000 mg | ORAL_TABLET | Freq: Every day | ORAL | Status: DC
Start: 2010-12-18 — End: 2013-10-22

## 2010-12-18 MED ORDER — METFORMIN HCL ER 500 MG PO TB24: 500.0000 mg | ORAL_TABLET | Freq: Two times a day (BID) | ORAL | Status: DC

## 2010-12-18 NOTE — Telephone Encounter (Signed)
Addended by: Mervin Kung on: 12/18/2010 03:01 PM   Modules accepted: Orders

## 2010-12-18 NOTE — Telephone Encounter (Signed)
Addended by: Mervin Kung on: 12/18/2010 03:03 PM   Modules accepted: Orders

## 2010-12-18 NOTE — Telephone Encounter (Signed)
Addended by: Mervin Kung on: 12/18/2010 03:05 PM   Modules accepted: Orders

## 2011-01-30 NOTE — Procedures (Signed)
NAME:  [name], [name].:  [id number]   MEDICAL RECORD NO.:  [id number]          PATIENT TYPE:  OUT   LOCATION:  SLEEP CENTER                 FACILITY[facility]  [facility]   PHYSICIAN:  Barbaraann Share, MD,FCCPDATE OF BIRTH:  1963/08/22   DATE OF STUDY:  03/04/2007                            NOCTURNAL POLYSOMNOGRAM   REFERRING PHYSICIAN:   REFERRING PHYSICIAN:  Barbaraann Share, MD,FCCP   INDICATIONS FOR PROCEDURE:  Hypersomnia with sleep apnea.  Epworth's  score; 22.   SLEEP ARCHITECTURE:  The patient had total sleep time of 394 minutes  with mildly decreased slow wave sleep as well as REM.  Sleep onset  latency was normal and REM onset was normal as well.  Sleep efficiency  was decreased at 90%.   RESPIRATORY DATA:  The patient was found to have 65 hypopneas and 11  apneas for an apnea/hypopnea index of 12 events per hour.  The events  were not positional, but they were clearly worse during REM.  There was  mild to moderate snoring noted throughout.   OXYGEN DATA:  There was O2 desaturation as low as 76% with the patient's  obstructive events.   CARDIAC DATA:  No clinically significant cardiac arrhythmias were noted.   MOVEMENT/PARASOMNIA:  Small numbers of leg jerks without significant  sleep disruption.   IMPRESSION:   RECOMMENDATIONS:  Mild obstructive sleep apnea/hypopnea with an  apnea/hypopnea index of 12 events per hour and O2 desaturation as low as  76%.  The events were clearly worse during REM, therefore increasing the  clinical impact on the patient's quality of life.  Treatment for this  degree of sleep apnea can include weight loss alone if applicable, upper  airway surgery, oral appliance, and also CPAP.  Clinical correlation is  suggested.      Barbaraann Share, MD,FCCP  Diplomate, American Board of Sleep  Medicine  Electronically Signed    [facility]  D:  03/18/2007 14:30:52  T:  03/18/2007 23:51:49  Job:  [id number]

## 2011-01-30 NOTE — Assessment & Plan Note (Signed)
[facility]                             PULMONARY OFFICE NOTE   KATILIN, RAYNES                         MRN:          [id number]  DATE:02/06/2007                            DOB:          Oct 20, 1960    HISTORY OF PRESENT ILLNESS:  The patient is a 50 year old female who I  have been asked to see for possible sleep apnea, as well as sleeping  difficulties. The patient states that she has had trouble sleeping for  at least 11 years. She states currently that she has been noted to have  loud snoring, but nobody has ever mentioned pauses in her breathing  during sleep. She does occasionally have gasping arousals two times a  week. The patient typically goes to bed at 10:30pm and gets up at 6am to  start her day. She is not rested upon arising. The patient works in  [employer], educational and notes significant inappropriate daytime  sleepiness with periods of inactivity. She will typically increase her  caffeine intake to a high level in order to stay awake. She can fall  asleep easily with movies or TV if she does not stay moving. She also  notes some degree of sleep pressure with driving. Of note, her weight is  neutral over the last two years.   PAST MEDICAL HISTORY:  1. Significant for hypertension.  2. Asthma.  3. Allergic rhinitis.  4. Status post cholecystectomy, hysterectomy, and tubal ligation.   CURRENT MEDICATIONS:  1. Nexium 40 mg daily.  2. Lisinopril 10 mg 1/2 daily.   ALLERGIES:  She has no known drug allergies.   SOCIAL HISTORY:  She has never smoked. She is married and has children.   FAMILY HISTORY:  Remarkable for her son having asthma and allergies,  otherwise this is not contributory in first degree relatives.   REVIEW OF SYSTEMS:  As per the history of present illness, also see  patient intake form documented on the chart.   PHYSICAL EXAMINATION:  GENERAL:  She is a obese female in no acute  distress.  VITAL SIGNS:   Blood pressure 120/80, pulse 70, temperature 98, weight  239 pounds, O2 saturation on room air is 99%.  HEENT:  Pupils equal, round, and reactive to light and accommodation,  extraocular muscles are intact. Nares show turbinate hypertrophy with  deviated septum to the right with narrowing, oropharynx does show a  small oral cavity with elongation of the soft palette and uvula.  NECK:  Supple without JVD or lymphadenopathy. There is no palpable  thyromegaly.  CHEST:  Totally clear.  CARDIAC:  Regular rate and rhythm, no murmurs, rubs, or gallops.  ABDOMEN:  Soft and nontender, nondistended, with good bowel sounds.  RECTAL:  Not done and indicated.  BREASTS:  Not done and indicated.  LOWER EXTREMITIES:  Without edema, pulses are intact distally.  NEUROLOGIC:  Alert and oriented with no obvious motor deficits.   IMPRESSION:  1. Probable obstructive sleep apnea. The patient is obese, has upper      airway abnormalities, and gives a classic history for this.  I have      had a long discussion with her about the short term quality of life      issues and the long term cardiovascular issues. I do think that      there is enough evidence to support proceeding with nocturnal      polysomnogram. The patient is agreeable to this approach.   PLAN:  1. Schedule for a nocturnal polysomnography.  2. Work on weight loss.  3. The patient will follow up after the above.     Barbaraann Share, MD,FCCP  Electronically Signed    [facility]/MedQ  DD: 02/27/2007  DT: 02/28/2007  Job #: [id number]   cc:   Barbette Hair. Artist Pais, DO

## 2011-02-02 NOTE — Discharge Summary (Signed)
[name], [name] NO.:  [id number]   MEDICAL RECORD NO.:  [id number]          PATIENT TYPE:  INP   LOCATION:  4735                         FACILITY:  [facility]   PHYSICIAN:  Rene Paci, M.D. LHCDATE OF BIRTH:  1961-03-14   DATE OF ADMISSION:  11/26/2005  DATE OF DISCHARGE:  11/27/2005                                 DISCHARGE SUMMARY   DISCHARGE DIAGNOSES:  1.  Chest pain.  2.  Anxiety/depression.  3.  History of asthma.   HISTORY OF PRESENT ILLNESS:  The patient is a 50 year old white female  admitted on November 26, 2005, with a chief complaint of chest pain. The  patient was admitted for further evaluation to rule out MI secondary to  multiple risk factors.   PAST MEDICAL HISTORY:  1.  Depression/anxiety.  2.  History of alcohol abuse, with loss of daughter.  3.  Migraine headache.  4.  History of anemia.  5.  Status post cholecystectomy.  6.  History of Cesarean section times three.  7.  Hysterectomy.  8.  History of lysis of adhesions.  9.  Irritable bowel syndrome.   COURSE OF HOSPITALIZATION:  Problem #1:  Chest pain. The patient underwent  cardiac enzymes on admission which were negative at the time of this  dictation. The second set of cardiac enzymes is pending. Plan to discharge  the patient home if cardiac enzymes are negative. The patient was seen by  cardiology and was evaluated by Dr. Charlton Haws. He feels that the patient  is stable for discharge home. In addition, a CT angio was performed which  did show some minimal thickening of his esophageal wall, but was negative  for PE. The patient's symptoms have resolved. The patient feels that these  symptoms are most likely secondary to  underlying anxiety in the setting of  recent stress in her life. She has been arranged for an outpatient Myoview  for next week with [facility].   MEDICATIONS AT DISCHARGE:  1.  Estradiol 1 mg p.o. daily.  2.  Advair 100/50 one puff p.o.  b.i.d.  3.  Librax one capsule two times daily as before.  4.  Aspirin 325 mg t.i.d.  5.  Metoprolol 25 mg p.o. b.i.d.  6.  Singulair 10 mg p.o. daily.  7.  Protonix 40 mg p.o. daily.   FOLLOWUP:  The patient is instructed to follow up with Dr. Blair Heys in  one to two weeks and to call for an appointment. She is to follow up next  week for an outpatient adenosine Myoview as scheduled and to follow up with  Dr. Charlton Haws as needed.      Melissa S. Peggyann Juba, NP      Rene Paci, M.D. [facility]  Electronically Signed    MSO/MEDQ  D:  11/27/2005  T:  11/28/2005  Job:  [id number]   cc:   Charlton Haws, M.D.  [address]  [address]  [place]  [place] [address]   Thomos Lemons, D.O. [facility]  [address] [place], [place] [address]

## 2011-02-02 NOTE — H&P (Signed)
NAMEKYLEEN, [name].:  [id number]   MEDICAL RECORD NO.:  [id number]          PATIENT TYPE:  INP   LOCATION:  4735                         FACILITY:  [facility]   PHYSICIAN:  Thomos Lemons, D.O. [facility]   DATE OF BIRTH:  1961/02/17   DATE OF ADMISSION:  11/26/2005  DATE OF DISCHARGE:                                HISTORY & PHYSICAL   CHIEF COMPLAINT:  Chest pain.   HISTORY OF PRESENT ILLNESS:  Patient is a 50 year old white female with past  medical history of migraine headache, irritable bowel syndrome who comes to  see me today regarding chest pain.  Patient states that first episode of  chest pain occurred this past Friday.  She woke up with the pain substernal  which she rated between 7-8/10.  Patient states that within a half an hour  to an hour pain subsided.  It has been intermittent since that time.  At  work today she noticed a recurrence of a severe chest discomfort that took  her breath away.  She notes some radiation of the pain down her left arm  and also back.  She denies any nausea or vomiting and chest pain is non-  exertional.  No change with deep inspiration or changes in position.  Patient also denies any associated shortness of breath or diaphoresis.   Patient at this time is currently asymptomatic, does not have any chest  discomfort.   PAST MEDICAL HISTORY:  1.  Depression/anxiety.  2.  History of alcohol abuse with loss of her daughter.  3.  Migraine headache.  4.  History of anemia.  5.  Status post cholecystectomy.  6.  History of cesarean section x3.  7.  Hysterectomy.  8.  History of lysis of adhesions.  9.  Irritable bowel syndrome.   CURRENT MEDICATIONS:  1.  Topamax 100 mg b.i.d.  She states that she stopped this on her own      recently.  2.  Singulair 10 mg once a day.  3.  Flonase p.r.n.  4.  Advair 100/50 one dose b.i.d.  5.  Albuterol p.r.n.  6.  Estradiol 1 mg one and a half tablets once a day.  7.  Librax one capsule  t.i.d.  8.  Darvocet p.r.n.   ALLERGIES:  She has a reaction to MORPHINE.   FAMILY HISTORY:  Remarkable for alcoholism.  Daughter has vitamin K  deficiency and autism.  Multiple family members noted to have type 2  diabetes and father with known heart disease.   SOCIAL HISTORY:  Patient is married.  Lives with her husband and child and  works as a Best boy for [employer].  Denies any tobacco or alcohol  use.   REVIEW OF SYSTEMS:  No fevers, chills.  Patient states that she has not had  any recent heartburn type symptoms or burning sensation in her abdomen or  chest.  No GU symptoms and all other systems negative.   PHYSICAL EXAMINATION:  VITAL SIGNS:  Weight is 240 pounds, temperature 97,  pulse 81, blood pressure 150/91 left arm  in a seated position.  GENERAL:  The patient is a pleasant 50 year old overweight white female in  no apparent distress.  HEENT:  Normocephalic, atraumatic.  Pupils are equal, round, and reactive to  light bilaterally.  Extraocular motility was intact.  Patient was anicteric.  Conjunctiva was within normal limits.  Oropharyngeal examination was  unremarkable.  NECK:  Supple.  No adenopathy, carotid bruit, or thyromegaly.  RESPIRATORY:  Normal respiratory effort.  CHEST:  Clear to auscultation bilaterally.  Patient had no palpable chest  tenderness and no pain with rotation of her lumbothoracic spine.  CARDIOVASCULAR:  Regular rate and rhythm.  No significant murmurs, rubs, or  gallops appreciated.  ABDOMEN:  Protuberant, nontender.  Positive bowel sounds.  No organomegaly.  MUSCULOSKELETAL:  No clubbing or cyanosis.  Patient had +1 pitting edema up  to mid calves bilaterally.  Intact pedis dorsalis pulses.  Negative calf  tenderness.   EKG was performed in the office which revealed normal sinus rhythm at 75  beats per minute.  No previous EKG to compare.  Patient had nonspecific ST  changes in the inferolateral leads.    IMPRESSION/RECOMMENDATIONS:  1.  Chest pain.  2.  History of irritable bowel.  3.  History of migraine.   RECOMMENDATIONS:  Patient certainly has typical and atypical features of  chest discomfort.  Due to her age, body habitus, blood pressure issues, and  family history she is higher risk and will be admitted for observation.  We  will cycle cardiac enzymes and [facility] was consulted for  evaluation.  She will be empirically started on aspirin and beta blocker and  empiric Lovenox 1 mg/kg will be started.  A D-dimer was also added to check  for possibility of DVT or pulmonary embolism.      Thomos Lemons, D.O. [facility]  Electronically Signed     RY/MEDQ  D:  11/26/2005  T:  11/26/2005  Job:  [id number]

## 2011-02-02 NOTE — H&P (Signed)
NAME:  [name], [name].:  [id number]   MEDICAL RECORD NO.:  [id number]          PATIENT TYPE:  INP   LOCATION:  4735                         FACILITY:  [facility]   PHYSICIAN:  Charlton Haws, M.D.     DATE OF BIRTH:  Oct 15, 1960   DATE OF ADMISSION:  11/26/2005  DATE OF DISCHARGE:                                HISTORY & PHYSICAL   Ms. [name] is a 50 year old patient admitted from the office by Dr. Artist Pais.  She has no history of coronary artery disease.  She has had a stress test  many years ago in [place].  At one point, she said she even had nitro  but has never had a heart cath.  She has a family history of coronary artery  disease.  She is overweight and has some hypertension.   The patient has been having some substernal chest pain over the last couple  of days.  It is atypical.  It is not exertional.  It has radiated to the jaw  and down the left arm.  There has been some shortness of breath with it.  She has had no lower extremity edema.  There has been no diaphoresis.   The patient's pain is nonexertional.  She is currently painfree.  She does  note that it is exacerbated sometimes with movement.  She was at work today  and had somewhat severe pain and was told to go home.  Saw Dr. Artist Pais in the  office.   PAST MEDICAL HISTORY:  Otherwise remarkable for multiple bowel surgeries.  She is status post cholecystectomy, status post C-section.  She has had  previous adhesion surgery in 1999, 2000, and 2001.  She has chronic  irritable bowel syndrome.  She also has a history of asthma, depression, and  migraines.   She is allergic to MORPHINE.  It causes a rash.   She lives with her husband in the [facility] area.  She is originally from  the [place] area.  She moved down here when he was transferred for his  work.  They have two children.  One is autistic and one has behavioral  issues but is extremely intelligent.  She works at [employer].   The patient's  medications on admission from Dr. Artist Pais included Advair, Librax,  Lopressor 12.5 b.i.d., aspirin per day, estradiol, Protonix.   PHYSICAL EXAMINATION:  VITAL SIGNS:  Blood pressure 130/70, pulse 70 and  regular.  LUNGS:  Clear.  HEART:  Normal S1 and S2 with normal heart sounds.  ABDOMEN:  Benign.  EXTREMITIES:  Lower extremities have intact pulses.  No edema.   EKG is normal.   The labs and chest x-ray are pending.   IMPRESSION:  The patient's pain is not typical of angina.  She has coronary  risk factors, so I think it is reasonable to proceed with a stress Myoview  in the morning if she rules out.  I think it is also reasonable to do a  chest CT on the patient, since her pain does not appear to be cardiac, and  this would rule out any other significant pathology in her  chest.  She is not allergic to iodine or shrimp.  If there is still a  problem with the technetium availability, and she rules out, it would also  be reasonable to do this Myoview as an outpatient.  Further recommendations  will be based on her clinical course, chest CT, and monitor overnight.           ______________________________  Charlton Haws, M.D.     PN/MEDQ  D:  11/26/2005  T:  11/26/2005  Job:  [id number]

## 2011-02-26 ENCOUNTER — Other Ambulatory Visit (HOSPITAL_COMMUNITY): Payer: Self-pay | Admitting: Internal Medicine

## 2011-02-26 DIAGNOSIS — Z1231 Encounter for screening mammogram for malignant neoplasm of breast: Secondary | ICD-10-CM

## 2011-03-05 ENCOUNTER — Encounter: Payer: Self-pay | Admitting: Internal Medicine

## 2011-03-05 ENCOUNTER — Other Ambulatory Visit: Payer: Self-pay | Admitting: Internal Medicine

## 2011-03-05 NOTE — Telephone Encounter (Signed)
Rx refill denied for Singulair. Last office visit with Dr Artist Pais was 01/02/2010

## 2011-04-02 ENCOUNTER — Ambulatory Visit (HOSPITAL_COMMUNITY): Payer: 59

## 2011-04-05 ENCOUNTER — Ambulatory Visit (HOSPITAL_COMMUNITY): Payer: 59

## 2011-04-10 ENCOUNTER — Ambulatory Visit: Payer: 59

## 2011-04-10 ENCOUNTER — Ambulatory Visit (HOSPITAL_COMMUNITY): Payer: 59

## 2011-04-18 ENCOUNTER — Ambulatory Visit
Admission: RE | Admit: 2011-04-18 | Discharge: 2011-04-18 | Disposition: A | Discharge status: Home / Self Care | Payer: 59 | Source: Ambulatory Visit | Attending: Internal Medicine | Admitting: Internal Medicine

## 2011-04-18 DIAGNOSIS — Z1231 Encounter for screening mammogram for malignant neoplasm of breast: Secondary | ICD-10-CM

## 2011-08-06 DIAGNOSIS — R351 Nocturia: Secondary | ICD-10-CM | POA: Insufficient documentation

## 2011-08-06 DIAGNOSIS — R102 Pelvic and perineal pain: Secondary | ICD-10-CM | POA: Insufficient documentation

## 2012-04-29 ENCOUNTER — Other Ambulatory Visit: Payer: Self-pay | Admitting: Internal Medicine

## 2012-04-29 DIAGNOSIS — Z1231 Encounter for screening mammogram for malignant neoplasm of breast: Secondary | ICD-10-CM

## 2012-06-02 ENCOUNTER — Encounter: Payer: Self-pay | Admitting: Gastroenterology

## 2012-06-09 ENCOUNTER — Ambulatory Visit
Admission: RE | Admit: 2012-06-09 | Discharge: 2012-06-09 | Disposition: A | Discharge status: Home / Self Care | Payer: 59 | Source: Ambulatory Visit | Attending: Internal Medicine | Admitting: Internal Medicine

## 2012-06-09 DIAGNOSIS — Z1231 Encounter for screening mammogram for malignant neoplasm of breast: Secondary | ICD-10-CM

## 2013-03-12 ENCOUNTER — Other Ambulatory Visit: Payer: Self-pay | Admitting: Geriatric Medicine

## 2013-03-12 DIAGNOSIS — R0989 Other specified symptoms and signs involving the circulatory and respiratory systems: Secondary | ICD-10-CM

## 2013-03-12 DIAGNOSIS — E039 Hypothyroidism, unspecified: Secondary | ICD-10-CM

## 2013-03-13 ENCOUNTER — Ambulatory Visit
Admission: RE | Admit: 2013-03-13 | Discharge: 2013-03-13 | Disposition: A | Discharge status: Home / Self Care | Payer: 59 | Source: Ambulatory Visit | Attending: Geriatric Medicine | Admitting: Geriatric Medicine

## 2013-03-13 DIAGNOSIS — R0989 Other specified symptoms and signs involving the circulatory and respiratory systems: Secondary | ICD-10-CM

## 2013-03-13 DIAGNOSIS — E039 Hypothyroidism, unspecified: Secondary | ICD-10-CM

## 2013-03-25 ENCOUNTER — Other Ambulatory Visit: Payer: 59

## 2013-03-25 ENCOUNTER — Ambulatory Visit
Admission: RE | Admit: 2013-03-25 | Discharge: 2013-03-25 | Disposition: A | Discharge status: Home / Self Care | Payer: 59 | Source: Ambulatory Visit | Attending: Geriatric Medicine | Admitting: Geriatric Medicine

## 2013-03-25 DIAGNOSIS — E039 Hypothyroidism, unspecified: Secondary | ICD-10-CM

## 2013-03-25 DIAGNOSIS — R0989 Other specified symptoms and signs involving the circulatory and respiratory systems: Secondary | ICD-10-CM

## 2013-03-27 ENCOUNTER — Ambulatory Visit: Payer: 59 | Admitting: Endocrinology

## 2013-03-30 ENCOUNTER — Encounter: Payer: Self-pay | Admitting: Endocrinology

## 2013-03-30 ENCOUNTER — Ambulatory Visit (INDEPENDENT_AMBULATORY_CARE_PROVIDER_SITE_OTHER): Payer: 59 | Admitting: Endocrinology

## 2013-03-30 VITALS — BP 128/84 | HR 78 | Ht 64.0 in | Wt 236.2 lb

## 2013-03-30 DIAGNOSIS — R1310 Dysphagia, unspecified: Secondary | ICD-10-CM

## 2013-03-30 DIAGNOSIS — E039 Hypothyroidism, unspecified: Secondary | ICD-10-CM

## 2013-03-30 DIAGNOSIS — E041 Nontoxic single thyroid nodule: Secondary | ICD-10-CM

## 2013-03-30 DIAGNOSIS — I10 Essential (primary) hypertension: Secondary | ICD-10-CM

## 2013-03-30 LAB — BASIC METABOLIC PANEL
CO2: 27 mEq/L (ref 19–32)
Chloride: 103 mEq/L (ref 96–112)
Potassium: 3.9 mEq/L (ref 3.5–5.1)
Sodium: 139 mEq/L (ref 135–145)

## 2013-03-30 NOTE — Patient Instructions (Addendum)
you will be scheduled for a followup thyroid ultrasound in 4 months Continue same doses of thyroid hormone Follow with primary care doctor as far as swallowing difficulties and fatigue

## 2013-03-30 NOTE — Progress Notes (Signed)
Patient ID: [name], female   DOB: [birth year], 52 y.o.   MRN: [id number] Reason for Appointment:  Hypothyroidism, new visit    History of Present Illness:   HYPOTHYROIDISM:  The Hyothyroidism was first diagnosed in 1/14  She was having nonspecific fatigue and her TSH was 4.6 and in 2012 had been 4.5 The patient was started on 25 mcg of levothyroxine but she did not feel any better with this.  Patient is still complaining of fatigue and also chronic  The symptoms consistent with hypothyroidism are: fatigue, cold which has been present for about a year. Her main concern is that she has some difficulty swallowing for the last 2 months. She does not have any choking sensation nonlocal pressure but she thinks her voice is a little raspy.  The last TSH was performed  on 03/12/13 and the result was 2.8 .  THYROID nodule:  She was sent for a thyroid ultrasound because of symptoms of difficulty swallowing in 6/14, and the results are as follows:  Right thyroid lobe: 5.9 x 1.5 x 1.9 cm.  Left thyroid lobe: 4.3 x 8.9 x 1.7 cm.  Isthmus: 2.5 mm in thickness.  Focal nodules: Right interpolar region nodule measuring 8 x 6 x 13  mm which  appears posterior and external to the gland.Solid lower  pole nodule on the right measuring 12 x 9 x 11 mm. Adjacent 4 mm  solid nodule. Simple cyst in the lower pole of the left lobe  measuring 1.5 x 0.7 x 2.3 cm. Left upper pole 4 mm nodule.  Lymphadenopathy: None visualized.    Medication List       This list is accurate as of: 03/30/13 11:25 AM.  Always use your most recent med list.               estradiol 1 MG tablet  Commonly known as:  ESTRACE  Take 1 mg by mouth. Take 1 -1/2 tablet by mouth once a day     eszopiclone 2 MG Tabs  Commonly known as:  LUNESTA  Take 2 mg by mouth at bedtime. Take immediately before bedtime     hydrochlorothiazide 12.5 MG tablet  Commonly known as:  HYDRODIURIL  Take 1 tablet (12.5 mg total) by mouth daily.      levothyroxine 25 MCG tablet  Commonly known as:  SYNTHROID, LEVOTHROID     loratadine 10 MG tablet  Commonly known as:  CLARITIN  Take 10 mg by mouth daily.     metFORMIN 500 MG 24 hr tablet  Commonly known as:  GLUCOPHAGE-XR  Take 1 tablet (500 mg total) by mouth 2 (two) times daily.     montelukast 10 MG tablet  Commonly known as:  SINGULAIR  Take 10 mg by mouth daily as needed.     oxybutynin 5 MG tablet  Commonly known as:  DITROPAN     rosuvastatin 10 MG tablet  Commonly known as:  CRESTOR  Take 1 tablet (10 mg total) by mouth daily.         Past Medical History  Diagnosis Date  . Allergic rhinitis   . Depression   . Hyperlipidemia   . Hypertension   . Seizure disorder   . Atypical chest pain   . Sialadenitis     seen by Dr Ezzard Standing in the past    Past Surgical History  Procedure Laterality Date  . Abdominal hysterectomy    . Cholecystectomy    . Tubal ligation  Family History  Problem Relation Age of Onset  . Alcohol abuse    . Autism Daughter     and vitamin K deficiency  . Coronary artery disease      multiple family members  . Diabetes      multiple family members  fam hypo sister  Social History:  reports that she has never smoked. She does not have any smokeless tobacco history on file. She reports that she does not drink alcohol or use illicit drugs.  Allergies:  Allergies  Allergen Reactions  . Morphine     Review of Systems:  CARDIOLOGY: no history of high blood pressure.            GASTROENTEROLOGY:   Change in bowel habits: She thinks her bowels are more loose now and somewhat more frequent ENDOCRINOLOGY:  she has a 5 year  history of Diabetes.  this is treated with metformin low dose. She thinks her blood sugars are well controlled with readings about 120-140 after meals. However her last A1c was 6.5%. About 2 years ago she had lost 30 pounds with significantly changing her diet and exercising. She generally watching  portions and does not eat any meat  She has aching in her muscles off-and-on for the last 2 years She has difficulty swallowing dry foods and this is a new symptom. She is able to swallow liquids well       Insomnia is also a problem but does not think she is depressed  She has some difficulties with overactive bladder No numbness or tingling in her hands or feet   Examination:   BP 128/84  Pulse 78  Ht 5\' 4"  (1.626 m)  Wt 236 lb 3.2 oz (107.14 kg)  BMI 40.52 kg/m2  SpO2 96%   General Appearance: pleasant, has generalized obesity without cushingoid features          Eyes: No prominence of eyes or eyelid swelling.   ENT: Oral mucosa normal         Neck: The thyroid is not palpable, no nodule felt on swallowing. There is no lymphadenopathy .    Cardiovascular: Normal apex and heart sounds, no murmur Respiratory:  Lungs clear Gastrointestinal: abdomen soft, NT, ND, BS+      Neurological: REFLEXES: at biceps are normal.     Skin: moist, warm, no rash        Assessments/Plan   1. Hypothyroidism: This is borderline and asymptomatic most likely as she had a TSH of only 4.6 at diagnosis compared to a similar level the year before. She can continue taking thyroid supplement since she has a family history of hypothyroidism and may have Hashimoto thyroiditis.  2. Fatigue: Discussed with patient that her fatigue is unrelated to her thyroid condition and will need to have her discuss this with PCP especially with her symptoms of insomnia and muscle aches  3. Thyroid nodule: She has a 12 mm thyroid nodule which is small and borderline for biopsy. Discussed that this is not causing her swallowing difficulty and given the low incidence of tumors at this size will refer to followup in about 4 months with repeat ultrasound ? Parathyroid adenoma seen on thyroid ultrasound: Will check a calcium level and if this is high normal or high will proceed with PTH assessment  4. Dysphagia: This is a  relatively new symptom and she may need referral to gastroenterologist, also would consider barium swallow to evaluate further   [facility] 03/30/2013, 11:25 AM  Addendum: Calcium normal, no need for assessing parathyroid 04/03/13: Discussed with Dr. Chestine Spore radiologist who felt that both the nodules seen are likely related to the thyroid and agree with followup ultrasound in 4 months

## 2013-04-01 DIAGNOSIS — R1310 Dysphagia, unspecified: Secondary | ICD-10-CM | POA: Insufficient documentation

## 2013-04-01 DIAGNOSIS — E039 Hypothyroidism, unspecified: Secondary | ICD-10-CM | POA: Insufficient documentation

## 2013-05-22 ENCOUNTER — Other Ambulatory Visit: Payer: Self-pay

## 2013-05-22 DIAGNOSIS — Z1231 Encounter for screening mammogram for malignant neoplasm of breast: Secondary | ICD-10-CM

## 2013-06-15 ENCOUNTER — Ambulatory Visit: Payer: 59

## 2013-07-16 ENCOUNTER — Ambulatory Visit: Payer: 59

## 2013-07-23 ENCOUNTER — Other Ambulatory Visit: Payer: Self-pay

## 2013-07-24 ENCOUNTER — Ambulatory Visit: Payer: 59 | Admitting: Endocrinology

## 2013-07-27 ENCOUNTER — Other Ambulatory Visit: Payer: 59

## 2013-08-26 ENCOUNTER — Ambulatory Visit
Admission: RE | Admit: 2013-08-26 | Discharge: 2013-08-26 | Disposition: A | Discharge status: Home / Self Care | Payer: 59 | Source: Ambulatory Visit

## 2013-08-26 DIAGNOSIS — Z1231 Encounter for screening mammogram for malignant neoplasm of breast: Secondary | ICD-10-CM

## 2013-09-08 ENCOUNTER — Ambulatory Visit (INDEPENDENT_AMBULATORY_CARE_PROVIDER_SITE_OTHER): Payer: 59

## 2013-09-08 ENCOUNTER — Telehealth: Payer: Self-pay | Admitting: *Deleted

## 2013-09-08 VITALS — BP 148/86 | HR 78 | Resp 12

## 2013-09-08 DIAGNOSIS — B351 Tinea unguium: Secondary | ICD-10-CM

## 2013-09-08 DIAGNOSIS — L909 Atrophic disorder of skin, unspecified: Secondary | ICD-10-CM

## 2013-09-08 DIAGNOSIS — L03039 Cellulitis of unspecified toe: Secondary | ICD-10-CM

## 2013-09-08 DIAGNOSIS — L918 Other hypertrophic disorders of the skin: Secondary | ICD-10-CM

## 2013-09-08 MED ORDER — TAVABOROLE 5 % EX SOLN: 1.0000 [drp] | Freq: Every day | CUTANEOUS | Status: DC

## 2013-09-08 MED ORDER — CEPHALEXIN 500 MG PO CAPS: 500.0000 mg | ORAL_CAPSULE | Freq: Three times a day (TID) | ORAL | Status: DC

## 2013-09-08 NOTE — Patient Instructions (Addendum)
Onychomycosis/Fungal Toenails  WHAT IS IT? An infection that lies within the keratin of your nail plate that is caused by a fungus.  WHY ME? Fungal infections affect all ages, sexes, races, and creeds.  There may be many factors that predispose you to a fungal infection such as age, coexisting medical conditions such as diabetes, or an autoimmune disease; stress, medications, fatigue, genetics, etc.  Bottom line: fungus thrives in a warm, moist environment and your shoes offer such a location.  IS IT CONTAGIOUS? Theoretically, yes.  You do not want to share shoes, nail clippers or files with someone who has fungal toenails.  Walking around barefoot in the same room or sleeping in the same bed is unlikely to transfer the organism.  It is important to realize, however, that fungus can spread easily from one nail to the next on the same foot.  HOW DO WE TREAT THIS?  There are several ways to treat this condition.  Treatment may depend on many factors such as age, medications, pregnancy, liver and kidney conditions, etc.  It is best to ask your doctor which options are available to you.  1. No treatment.   Unlike many other medical concerns, you can live with this condition.  However for many people this can be a painful condition and may lead to ingrown toenails or a bacterial infection.  It is recommended that you keep the nails cut short to help reduce the amount of fungal nail. 2. Topical treatment.  These range from herbal remedies to prescription strength nail lacquers.  About 40-50% effective, topicals require twice daily application for approximately 9 to 12 months or until an entirely new nail has grown out.  The most effective topicals are medical grade medications available through physicians offices. 3. Oral antifungal medications.  With an 80-90% cure rate, the most common oral medication requires 3 to 4 months of therapy and stays in your system for a year as the new nail grows out.  Oral  antifungal medications do require blood work to make sure it is a safe drug for you.  A liver function panel will be performed prior to starting the medication and after the first month of treatment.  It is important to have the blood work performed to avoid any harmful side effects.  In general, this medication safe but blood work is required. 4. Laser Therapy.  This treatment is performed by applying a specialized laser to the affected nail plate.  This therapy is noninvasive, fast, and non-painful.  It is not covered by insurance and is therefore, out of pocket.  The results have been very good with a 80-95% cure rate.  [facility] is the only practice in the area to offer this therapy. 5. Permanent Nail Avulsion.  Removing the entire nail so that a new nail will not grow back.  Once a week or once every other week soak the toe in vinegar and warm water suggest one cup of vinegar to a gallon of warm water soak for 15 minutes

## 2013-09-08 NOTE — Progress Notes (Signed)
   Subjective:    Patient ID: [name], female    DOB: 12/02/1960, 52 y.o.   MRN: [id number]  HPI Comments: ''THE RT FOOT GREAT TOENAIL START BOTHERING ME AGAIN FOR 6 WEEKS AND CANNOT PUT PRESSURE ON IT. TREATMENT TRIED NEOSPORIN AND HELPS A LOT.     Review of Systems  All other systems reviewed and are negative.       Objective:   Physical Exam Neurovascular status is intact pedal pulses palpable. Epicritic and proprioceptive sensations intact and symmetric bilateral. Normal plantar response and DTRs noted. Neurologically the right hallux nail lateral one half is auto avulsed there is some friability of the nailbed with some hyperpigmentation and dried blood. Patient may have evidence of small skin tag of the proximal nail fold which may be extruding factor. Patient had been applying urea cream however the nail become very brittle and crumbly and most of it fell off. Recently the lateral nail fold became red and erythematous edematous with posse secondary bacterial infection.       Assessment & Plan:  Assessment this time is onychomycosis arthrosis of the nail lateral half of the right hallux nail plate is involved. There may be a small skin tag present may be addressed at a future date. At this time suggested topical antifungal treatment a prescription and samples of kerydin are given and called in for patient's pharmacy. Also patient placed on a regimen cephalexin 500 mg 3 times a day x10 days for the bacterial infection lateral nail fold and paronychia. Followup with the next 3-4 weeks for reevaluation and patient devised or tape 1 year or 12 months in daily application for the nail to improve significantly.  Alvan Dame DPM

## 2013-09-08 NOTE — Telephone Encounter (Signed)
Pt requested the pharmacy be changed to [facility] for the Cephalexin, and the Janalyn Harder is sent to the [facility].  I informed pt that her changes had been made.

## 2013-09-17 DIAGNOSIS — C439 Malignant melanoma of skin, unspecified: Secondary | ICD-10-CM | POA: Insufficient documentation

## 2013-09-17 HISTORY — PX: MELANOMA EXCISION: SHX5266

## 2013-09-17 HISTORY — DX: Malignant melanoma of skin, unspecified: C43.9

## 2013-10-06 ENCOUNTER — Ambulatory Visit (INDEPENDENT_AMBULATORY_CARE_PROVIDER_SITE_OTHER): Payer: 59

## 2013-10-06 VITALS — BP 155/62 | HR 72 | Resp 16

## 2013-10-06 DIAGNOSIS — B351 Tinea unguium: Secondary | ICD-10-CM

## 2013-10-06 DIAGNOSIS — L909 Atrophic disorder of skin, unspecified: Secondary | ICD-10-CM

## 2013-10-06 DIAGNOSIS — L918 Other hypertrophic disorders of the skin: Secondary | ICD-10-CM

## 2013-10-06 DIAGNOSIS — L919 Hypertrophic disorder of the skin, unspecified: Secondary | ICD-10-CM

## 2013-10-06 NOTE — Progress Notes (Signed)
   Subjective:    Patient ID: [name], female    DOB: 11-01-1960, 53 y.o.   MRN: [id number]  HPI Comments: "My toe looks much better and its not sore anymore either" (1st toe right-lateral)     Review of Systems no new changes or findings     Objective:   Physical Exam Neurovascular status is intact pedal pulses palpable there is still slight area of her skin taken from the proximal lateral nail fold of the right great toe. The skin tag is created an indentation in the lateral nail plate however there is no discharge drainage no signs of infection no purulence slight thickening of the nail consistent with onychomycosis. Patient is continuing to apply topical antifungal in the form of Kerydin daily to the affected nail. We'll continue to do so as instructed.       Assessment & Plan:  Resultant paronychia of nail patient continues to have a skin tag and dystrophy and onychomycosis the nail will continue with topical antifungal therapies daily application. Suggest a 6-12 month long-term followup if at any time there is an exacerbation or recurrence of infection consideration for partial nail excision and skin tag removal maybe the next option. Followup in 6 months if it is indicated  Harriet Masson DPM

## 2013-10-06 NOTE — Patient Instructions (Signed)
Onychomycosis/Fungal Toenails  WHAT IS IT? An infection that lies within the keratin of your nail plate that is caused by a fungus.  WHY ME? Fungal infections affect all ages, sexes, races, and creeds.  There may be many factors that predispose you to a fungal infection such as age, coexisting medical conditions such as diabetes, or an autoimmune disease; stress, medications, fatigue, genetics, etc.  Bottom line: fungus thrives in a warm, moist environment and your shoes offer such a location.  IS IT CONTAGIOUS? Theoretically, yes.  You do not want to share shoes, nail clippers or files with someone who has fungal toenails.  Walking around barefoot in the same room or sleeping in the same bed is unlikely to transfer the organism.  It is important to realize, however, that fungus can spread easily from one nail to the next on the same foot.  HOW DO WE TREAT THIS?  There are several ways to treat this condition.  Treatment may depend on many factors such as age, medications, pregnancy, liver and kidney conditions, etc.  It is best to ask your doctor which options are available to you.  1. No treatment.   Unlike many other medical concerns, you can live with this condition.  However for many people this can be a painful condition and may lead to ingrown toenails or a bacterial infection.  It is recommended that you keep the nails cut short to help reduce the amount of fungal nail. 2. Topical treatment.  These range from herbal remedies to prescription strength nail lacquers.  About 40-50% effective, topicals require twice daily application for approximately 9 to 12 months or until an entirely new nail has grown out.  The most effective topicals are medical grade medications available through physicians offices. 3. Oral antifungal medications.  With an 80-90% cure rate, the most common oral medication requires 3 to 4 months of therapy and stays in your system for a year as the new nail grows out.  Oral  antifungal medications do require blood work to make sure it is a safe drug for you.  A liver function panel will be performed prior to starting the medication and after the first month of treatment.  It is important to have the blood work performed to avoid any harmful side effects.  In general, this medication safe but blood work is required. 4. Laser Therapy.  This treatment is performed by applying a specialized laser to the affected nail plate.  This therapy is noninvasive, fast, and non-painful.  It is not covered by insurance and is therefore, out of pocket.  The results have been very good with a 80-95% cure rate.  The [facility] is the only practice in the area to offer this therapy. 5. Permanent Nail Avulsion.  Removing the entire nail so that a new nail will not grow back.   Continue topical nail antifungal daily for at least a 6-12 months duration

## 2013-10-12 DIAGNOSIS — R9431 Abnormal electrocardiogram [ECG] [EKG]: Secondary | ICD-10-CM | POA: Insufficient documentation

## 2013-10-16 ENCOUNTER — Other Ambulatory Visit: Payer: Self-pay

## 2013-10-22 ENCOUNTER — Ambulatory Visit (INDEPENDENT_AMBULATORY_CARE_PROVIDER_SITE_OTHER): Payer: 59 | Admitting: Cardiology

## 2013-10-22 ENCOUNTER — Encounter: Payer: Self-pay | Admitting: Cardiology

## 2013-10-22 VITALS — BP 150/96 | HR 89 | Ht 63.0 in | Wt 237.0 lb

## 2013-10-22 DIAGNOSIS — E785 Hyperlipidemia, unspecified: Secondary | ICD-10-CM

## 2013-10-22 DIAGNOSIS — I10 Essential (primary) hypertension: Secondary | ICD-10-CM

## 2013-10-22 DIAGNOSIS — R079 Chest pain, unspecified: Secondary | ICD-10-CM

## 2013-10-22 NOTE — Assessment & Plan Note (Signed)
Continue statin. 

## 2013-10-22 NOTE — Patient Instructions (Signed)
Your physician recommends that you schedule a follow-up appointment in: [place] physician has requested that you have an exercise tolerance test. For further information please visit [email or web address]. Please also follow instruction sheet, as given.

## 2013-10-22 NOTE — Progress Notes (Signed)
HPI: 53 year old female with no prior cardiac history for evaluation of chest pain. Patient has some dyspnea on exertion but no exertional chest pain. No orthopnea or PND. Occasional mild pedal edema. Approximately one week ago she developed chest pain that occurred after using her upper extremities. The pain was described as an ache without radiation. No associated symptoms. Duration 2 days. Increase with moving arms. Seen by her primary care an electrocardiogram reportedly showed lateral flattening of T waves. Cardiology asked to evaluate.  Current Outpatient Prescriptions  Medication Sig Dispense Refill  . albuterol (PROVENTIL HFA;VENTOLIN HFA) 108 (90 BASE) MCG/ACT inhaler Inhale 1 puff into the lungs every 6 (six) hours as needed for wheezing or shortness of breath.      Marland Kitchen aspirin 81 MG tablet Take 81 mg by mouth daily.      . Chromium Picolinate 800 MCG TABS Take by mouth.      . eszopiclone (LUNESTA) 2 MG TABS Take 2 mg by mouth at bedtime. Take immediately before bedtime       . hydrochlorothiazide (HYDRODIURIL) 25 MG tablet Take 25 mg by mouth daily.      Marland Kitchen levothyroxine (SYNTHROID, LEVOTHROID) 25 MCG tablet Take 25 mcg by mouth daily before breakfast.       . loratadine (CLARITIN) 10 MG tablet Take 10 mg by mouth daily.      . metFORMIN (GLUCOPHAGE-XR) 500 MG 24 hr tablet Take 1,000 mg by mouth 2 (two) times daily.      . montelukast (SINGULAIR) 10 MG tablet Take 10 mg by mouth daily as needed.        Marland Kitchen omeprazole (PRILOSEC) 20 MG capsule Take 20 mg by mouth daily.      Marland Kitchen oxybutynin (DITROPAN) 5 MG tablet       . rosuvastatin (CRESTOR) 10 MG tablet Take 1 tablet (10 mg total) by mouth daily.  90 tablet  0  . Tavaborole (KERYDIN) 5 % SOLN Apply 1 drop topically daily.  10 mL  10   No current facility-administered medications for this visit.     Past Medical History  Diagnosis Date  . Allergic rhinitis   . Depression   . Hyperlipidemia   . Hypertension   . Seizure  disorder   . Sialadenitis     seen by Dr Lucia Gaskins in the past  . Diabetes mellitus   . Hypothyroid   . Asthma   . GERD (gastroesophageal reflux disease)     Past Surgical History  Procedure Laterality Date  . Abdominal hysterectomy    . Cholecystectomy    . Tubal ligation      History   Social History  . Marital Status: Married    Spouse Name: N/A    Number of Children: 2  . Years of Education: N/A   Occupational History  .     Social History Main Topics  . Smoking status: Never Smoker   . Smokeless tobacco: Not on file  . Alcohol Use: Yes     Comment: Rarely  . Drug Use: No  . Sexual Activity: Not on file   Other Topics Concern  . Not on file   Social History Narrative   Occupation:  Designer, jewellery for [employer]   Married -  Lives with her husband and children    Never Smoked    Alcohol use-no             ROS: no fevers or chills, productive cough, hemoptysis, dysphasia, odynophagia,  melena, hematochezia, dysuria, hematuria, rash, seizure activity, orthopnea, PND, pedal edema, claudication. Remaining systems are negative.  Physical Exam: Well-developed obese in no acute distress.  Skin is warm and dry.  HEENT is normal.  Neck is supple. Normal upstroke. No bruits. Chest is clear to auscultation with normal expansion.  Cardiovascular exam is regular rate and rhythm. No murmurs rubs or gallops. Abdominal exam nontender or distended. No masses palpated. No hepatosplenomegaly. 2+ femoral pulses with no bruits. Extremities show no edema. 2+ DP. neuro grossly intact  ECG sinus rhythm at a rate of 89. No ST changes.

## 2013-10-22 NOTE — Assessment & Plan Note (Signed)
Blood pressure is elevated today but typically controlled by her report. Continue present medications.

## 2013-10-22 NOTE — Assessment & Plan Note (Signed)
Symptoms are atypical and most likely musculoskeletal. However she has multiple risk factors and is planning on starting an exercise program. An exercise treadmill for risk stratification.

## 2013-11-17 ENCOUNTER — Other Ambulatory Visit: Payer: Self-pay | Admitting: Dermatology

## 2013-11-27 ENCOUNTER — Other Ambulatory Visit: Payer: Self-pay

## 2013-12-08 ENCOUNTER — Encounter: Payer: 59 | Admitting: Physician Assistant

## 2013-12-16 ENCOUNTER — Telehealth: Payer: Self-pay | Admitting: *Deleted

## 2013-12-16 ENCOUNTER — Other Ambulatory Visit: Payer: Self-pay | Admitting: *Deleted

## 2013-12-16 NOTE — Telephone Encounter (Signed)
Pharmacy sent over a refill request for Cephalexin Cap 500 mg. Sig: 1 PO TID , qty. 21.  Dr. Blenda Mounts denied the refill, stated patient needs to schedule an appointment to be seen.  I called and informed the patient.

## 2013-12-16 NOTE — Telephone Encounter (Signed)
I called patient to get her to schedule an appointment due to her wanting a refill for Cephalexin which was denied.  She stated she ordered the refill in error. She said she should have called and told us.  She said her toe is fine.

## 2013-12-23 ENCOUNTER — Encounter: Payer: 59 | Admitting: Physician Assistant

## 2014-02-03 ENCOUNTER — Encounter: Payer: 59 | Admitting: Nurse Practitioner

## 2014-02-24 ENCOUNTER — Encounter: Payer: 59 | Admitting: Physician Assistant

## 2014-03-31 ENCOUNTER — Ambulatory Visit (INDEPENDENT_AMBULATORY_CARE_PROVIDER_SITE_OTHER): Payer: 59 | Admitting: Physician Assistant

## 2014-03-31 DIAGNOSIS — R55 Syncope and collapse: Secondary | ICD-10-CM

## 2014-03-31 DIAGNOSIS — R079 Chest pain, unspecified: Secondary | ICD-10-CM

## 2014-03-31 NOTE — Progress Notes (Signed)
Exercise Treadmill Test  Pre-Exercise Testing Evaluation Rhythm: normal sinus  Rate: 79 bpm     Test  Exercise Tolerance Test Ordering MD: Kirk Ruths, MD  Interpreting MD: Richardson Dopp, PA-C  Unique Test No: 1  Treadmill:  1  Indication for ETT: chest pain - rule out ischemia  Contraindication to ETT: No   Stress Modality: exercise - treadmill  Cardiac Imaging Performed: non   Protocol: standard Bruce - maximal  Max BP:  213/78  Max MPHR (bpm):  170 85% MPR (bpm):  145  MPHR obtained (bpm):  153 % MPHR obtained:  90  Reached 85% MPHR (min:sec):  3:22 Total Exercise Time (min-sec):  6:00  Workload in METS:  7.0 Borg Scale: 15  Reason ETT Terminated:  patient's desire to stop    ST Segment Analysis At Rest: normal ST segments - no evidence of significant ST depression With Exercise: no evidence of significant ST depression  Other Information Arrhythmia:  No Angina during ETT:  absent (0) Quality of ETT:  diagnostic  ETT Interpretation:  normal - no evidence of ischemia by ST analysis  Comments: Fair exercise capacity. No chest pain. Normal BP response to exercise. No ST changes to suggest ischemia.   Patient tells me she passed out yesterday.  She was sitting at her desk at work and suddenly felt lightheaded prior to her syncope.  No assoc symptoms.  She has a hx of syncope related to seizures.  She had no seizure activity. She did not seek medical attention.  She did not check her BP or sugar.  She denies any post ictal state symptoms.  Recommendations: Unexplained syncope - I will arrange an Echo and Event monitor. She has follow up soon with her PCP. I will also arrange follow up with Dr. Kirk Ruths after her echo and monitor. She has been told to not drive x 6 mos. Signed,  Richardson Dopp, PA-C   03/31/2014 9:33 AM

## 2014-03-31 NOTE — Patient Instructions (Signed)
Your physician recommends that you continue on your current medications as directed. Please refer to the Current Medication list given to you today.  Your physician has recommended that you wear an event monitor. Event monitors are medical devices that record the heart's electrical activity. Doctors most often [place] these monitors to diagnose arrhythmias. Arrhythmias are problems with the speed or rhythm of the heartbeat. The monitor is a small, portable device. You can wear one while you do your normal daily activities. This is usually used to diagnose what is causing palpitations/syncope (passing out).  Your physician has requested that you have an echocardiogram. Echocardiography is a painless test that uses sound waves to create images of your heart. It provides your doctor with information about the size and shape of your heart and how well your heart's chambers and valves are working. This procedure takes approximately one hour. There are no restrictions for this procedure.  We advise that you do not drive for the next 6 months  Your physician recommends that you schedule a follow-up appointment with Dr Stanford Breed after test are done

## 2014-04-06 ENCOUNTER — Ambulatory Visit: Payer: 59

## 2014-04-09 ENCOUNTER — Ambulatory Visit: Payer: 59

## 2014-04-12 ENCOUNTER — Encounter: Payer: Self-pay | Admitting: Physician Assistant

## 2014-04-12 ENCOUNTER — Ambulatory Visit (HOSPITAL_COMMUNITY): Payer: 59 | Attending: Cardiology | Admitting: Radiology

## 2014-04-12 ENCOUNTER — Encounter (INDEPENDENT_AMBULATORY_CARE_PROVIDER_SITE_OTHER): Payer: 59

## 2014-04-12 ENCOUNTER — Encounter: Payer: Self-pay | Admitting: *Deleted

## 2014-04-12 DIAGNOSIS — R609 Edema, unspecified: Secondary | ICD-10-CM | POA: Diagnosis not present

## 2014-04-12 DIAGNOSIS — R55 Syncope and collapse: Secondary | ICD-10-CM | POA: Diagnosis present

## 2014-04-12 DIAGNOSIS — I059 Rheumatic mitral valve disease, unspecified: Secondary | ICD-10-CM | POA: Insufficient documentation

## 2014-04-12 DIAGNOSIS — I10 Essential (primary) hypertension: Secondary | ICD-10-CM | POA: Diagnosis not present

## 2014-04-12 DIAGNOSIS — E039 Hypothyroidism, unspecified: Secondary | ICD-10-CM | POA: Diagnosis not present

## 2014-04-12 NOTE — Progress Notes (Signed)
Echocardiogram performed.  

## 2014-04-12 NOTE — Progress Notes (Signed)
Patient ID: [name], female   DOB: 23-Aug-1961, 53 y.o.   MRN: [id number] [employer] 30 day cardiac event monitor applied to patient.

## 2014-04-15 ENCOUNTER — Encounter: Payer: Self-pay | Admitting: Cardiology

## 2014-04-16 ENCOUNTER — Encounter: Payer: Self-pay | Admitting: *Deleted

## 2014-04-16 ENCOUNTER — Ambulatory Visit (INDEPENDENT_AMBULATORY_CARE_PROVIDER_SITE_OTHER): Payer: 59

## 2014-04-16 VITALS — BP 153/86 | HR 72 | Resp 16

## 2014-04-16 DIAGNOSIS — Z87898 Personal history of other specified conditions: Secondary | ICD-10-CM

## 2014-04-16 DIAGNOSIS — B351 Tinea unguium: Secondary | ICD-10-CM

## 2014-04-16 DIAGNOSIS — L919 Hypertrophic disorder of the skin, unspecified: Secondary | ICD-10-CM

## 2014-04-16 DIAGNOSIS — L909 Atrophic disorder of skin, unspecified: Secondary | ICD-10-CM

## 2014-04-16 DIAGNOSIS — L918 Other hypertrophic disorders of the skin: Secondary | ICD-10-CM

## 2014-04-16 MED ORDER — HYDROCODONE-ACETAMINOPHEN 5-325 MG PO TABS: 1.0000 | ORAL_TABLET | ORAL | Status: DC | PRN

## 2014-04-16 MED ORDER — CLINDAMYCIN HCL 150 MG PO CAPS
150.0000 mg | ORAL_CAPSULE | Freq: Four times a day (QID) | ORAL | Status: DC
Start: 2014-04-16 — End: 2014-06-07

## 2014-04-16 NOTE — Progress Notes (Signed)
Skin tag with associated toenail 1st toe right sent out to [facility] pathology.

## 2014-04-16 NOTE — Progress Notes (Signed)
   Subjective:    Patient ID: [name], female    DOB: [birth year], 53 y.o.   MRN: [id number]  HPI Comments: "My family doctor asked why weren't we removing this skin tag. I have to say, this skin tag gets caught on my sock all the time and starts to bleed."  Follow up 1st toe right - lateral border  Toe Pain       Review of Systems no new findings or systemic changes noted     Objective:   Physical Exam 53 year old [name] presents at this time well-developed well-nourished return 3 continues to have dystrophy and skin tag of the right hallux lateral border. She's getting caught irritated and at this time is requesting surgical intervention for removal that tag and the dystrophy of the nail .  Lower extremity objective findings as follows vascular status is intact with pedal pulses palpable DP postal for PT plus one over 4 capillary refill time 3 seconds all digits skin temperature warm turgor normal no edema rubor pallor or varicosities. Neurologically epicritic and proprioceptive sensations intact is normal plantar response DTRs not listed dermatologic the skin color pigment normal hair growth absent there is a skin tag proximal nail fold and lateral proximal lateral nail fold of the right great toe with dystrophic lateral half of the nail being noted with a large indentation nail plate and nailbed. Patient has concern about this and she's had several episodes of skin cancers removed recently for her back and torso. Orthopedic exam otherwise unremarkable no other abnormal findings no signs of infection noted.       Assessment & Plan:  Assessment this time is benign her neoplasm skin unknown origin. Likely skin tag of the proximal nail fold with dystrophy of the nail however cannot rule out neoplasm. There is dystrophy with possible onychomycosis of nail as well. Plan at this time recommended excision of lateral nail plate and excision of skin lesion proximal nail fold with  appropriate biopsy. Recommendations for phenol matricectomy prevent recurrence of dystrophic nail. At this time local anesthetic block is administered to the right great toe Betadine prep was performed the nail plate is loose. However portion of nail plate seemed to be attached intimately with the skin tag at the proximal nail fold. Utilizing sharp dissection beneath the proximal nail fold the skin tag was dissected free from its base it with the proximal nail fold it is excised and submitted in formalin for pathology analysis. At this time the base of the nailbed and proximal nail fold is treated with 5/18 applications of phenol followed by alcohol wash Silvadene cream and gauze dressing being applied patient how the procedure well was instructions for daily Betadine or Epsom salts in warm water soaks prescription for antibiotic clindamycin as well as pain medication are dispensed at this time. Reappointed 2 weeks for followup and nail check.  Addendum at this time is it as patient was laid in the office at check out at some point breakthrough bleeding of the hallux and was taken back to treatment room by nursing staff and additional redressing was applied to the hallux patient's foot was elevated in the bleeding was controlled and toe redressed appropriately. Patient is advised to use ice and elevate the foot and 2 keep it elevated and rest for the next 24 hours. And then begin soaking instructions as instructed  [name] DPM

## 2014-04-16 NOTE — Patient Instructions (Signed)

## 2014-04-30 ENCOUNTER — Ambulatory Visit (INDEPENDENT_AMBULATORY_CARE_PROVIDER_SITE_OTHER): Payer: 59

## 2014-04-30 VITALS — BP 125/78 | HR 65 | Resp 17

## 2014-04-30 DIAGNOSIS — L919 Hypertrophic disorder of the skin, unspecified: Secondary | ICD-10-CM

## 2014-04-30 DIAGNOSIS — B351 Tinea unguium: Secondary | ICD-10-CM

## 2014-04-30 DIAGNOSIS — Z87898 Personal history of other specified conditions: Secondary | ICD-10-CM

## 2014-04-30 DIAGNOSIS — L909 Atrophic disorder of skin, unspecified: Secondary | ICD-10-CM

## 2014-04-30 DIAGNOSIS — L918 Other hypertrophic disorders of the skin: Secondary | ICD-10-CM

## 2014-04-30 DIAGNOSIS — Z09 Encounter for follow-up examination after completed treatment for conditions other than malignant neoplasm: Secondary | ICD-10-CM

## 2014-04-30 NOTE — Patient Instructions (Signed)

## 2014-04-30 NOTE — Progress Notes (Signed)
   Subjective:    Patient ID: [name], female    DOB: 11-01-1960, 53 y.o.   MRN: [id number]  HPI  Pt presents for nail recheck, ingrown removal right great toe, she has been doing soaks daily, stopped soaking 4 days ago. States that the pain has improved  Review of Systems no new findings or systemic changes       Objective:   Physical Exam Neurovascular status is intact pedal pulses palpable the lesion was removed nail was noted to have some fibrosis and scar tissue from trauma posse some onychomycosis however the skin tag or soft tissue lesion which was excised is noted to be a fibromatous mass benign neoplasm of skin no malignancy was identified patient currently has no pain no discharge slight eschar tissue present proximal lateral nail fold right great toe patient stopped soaking is no longer draining no pain or discomfort noted       Assessment & Plan:  Assessment good postop progress patient discharge to an as-needed basis for any future followup continue to monitor for recurrence patient is advised you know is benign as well as possible to have a recurrence of skin tags or labrum was lesions or granulomatous lesions. Contact us in the future and as-needed basis if any recurrence otherwise discharged from our care  Harriet Masson DPM

## 2014-06-07 ENCOUNTER — Ambulatory Visit (INDEPENDENT_AMBULATORY_CARE_PROVIDER_SITE_OTHER): Payer: 59 | Admitting: Cardiology

## 2014-06-07 ENCOUNTER — Encounter: Payer: Self-pay | Admitting: Cardiology

## 2014-06-07 VITALS — BP 110/60 | HR 81 | Ht 63.0 in | Wt 227.0 lb

## 2014-06-07 DIAGNOSIS — I10 Essential (primary) hypertension: Secondary | ICD-10-CM

## 2014-06-07 DIAGNOSIS — R55 Syncope and collapse: Secondary | ICD-10-CM

## 2014-06-07 DIAGNOSIS — E785 Hyperlipidemia, unspecified: Secondary | ICD-10-CM

## 2014-06-07 DIAGNOSIS — R072 Precordial pain: Secondary | ICD-10-CM

## 2014-06-07 NOTE — Assessment & Plan Note (Signed)
Blood pressure controlled. Continue present medications. 

## 2014-06-07 NOTE — Assessment & Plan Note (Signed)
Continue statin. 

## 2014-06-07 NOTE — Assessment & Plan Note (Signed)
No further symptoms. Treadmill negative. No further workup.

## 2014-06-07 NOTE — Assessment & Plan Note (Signed)
No further episodes. LV function normal. Monitor unremarkable. Will followup if she has further episodes.

## 2014-06-07 NOTE — Progress Notes (Signed)
HPI: FU chest pain and syncope. Carotid dopplers 7/14 showed < 50 bilateral stenosis. Echo 7/15 showed normal LV function; grade 1 diastolic dysfunction; trace MR. ETT 7/15 normal. At time of ETT, pt described a syncopal episode. Monitor revealed Sinus rhythm. Since last seen, the patient denies any dyspnea on exertion, orthopnea, PND, pedal edema, palpitations, syncope or chest pain.    Current Outpatient Prescriptions  Medication Sig Dispense Refill  . hydrochlorothiazide (HYDRODIURIL) 25 MG tablet Take 25 mg by mouth daily.      Marland Kitchen albuterol (PROVENTIL HFA;VENTOLIN HFA) 108 (90 BASE) MCG/ACT inhaler Inhale 1 puff into the lungs every 6 (six) hours as needed for wheezing or shortness of breath.      Marland Kitchen aspirin 81 MG tablet Take 81 mg by mouth daily.      . Chromium Picolinate 800 MCG TABS Take by mouth.      . eszopiclone (LUNESTA) 2 MG TABS Take 2 mg by mouth at bedtime. Take immediately before bedtime       . levothyroxine (SYNTHROID, LEVOTHROID) 25 MCG tablet Take 25 mcg by mouth daily before breakfast.       . loratadine (CLARITIN) 10 MG tablet Take 10 mg by mouth daily.      . metFORMIN (GLUCOPHAGE-XR) 500 MG 24 hr tablet Take 1,000 mg by mouth 2 (two) times daily.      . montelukast (SINGULAIR) 10 MG tablet Take 10 mg by mouth daily as needed.        . rosuvastatin (CRESTOR) 10 MG tablet Take 1 tablet (10 mg total) by mouth daily.  90 tablet  0  . triamterene-hydrochlorothiazide (MAXZIDE-25) 37.5-25 MG per tablet Take 1 tablet by mouth daily.       No current facility-administered medications for this visit.     Past Medical History  Diagnosis Date  . Allergic rhinitis   . Depression   . Hyperlipidemia   . Hypertension   . Seizure disorder   . Sialadenitis     seen by Dr Lucia Gaskins in the past  . Diabetes mellitus   . Hypothyroid   . Asthma   . GERD (gastroesophageal reflux disease)   . Hx of echocardiogram     a. Echo (7/15): Mild focal basal hypertrophy of the  septum, EF 55-60%, normal wall motion, grade 1 diastolic dysfunction    Past Surgical History  Procedure Laterality Date  . Abdominal hysterectomy    . Cholecystectomy    . Tubal ligation      History   Social History  . Marital Status: Married    Spouse Name: N/A    Number of Children: 2  . Years of Education: N/A   Occupational History  .     Social History Main Topics  . Smoking status: Never Smoker   . Smokeless tobacco: Not on file  . Alcohol Use: Yes     Comment: Rarely  . Drug Use: No  . Sexual Activity: Not on file   Other Topics Concern  . Not on file   Social History Narrative   Occupation:  Designer, jewellery for [employer]   Married -  Lives with her husband and children    Never Smoked    Alcohol use-no             ROS: no fevers or chills, productive cough, hemoptysis, dysphasia, odynophagia, melena, hematochezia, dysuria, hematuria, rash, seizure activity, orthopnea, PND, pedal edema, claudication. Remaining systems are negative.  Physical Exam: Well-developed well-nourished  in no acute distress.  Skin is warm and dry.  HEENT is normal.  Neck is supple.  Chest is clear to auscultation with normal expansion.  Cardiovascular exam is regular rate and rhythm.  Abdominal exam nontender or distended. No masses palpated. Extremities show no edema. neuro grossly intact  ECG Sinus rhythm, no ST changes.

## 2014-06-07 NOTE — Patient Instructions (Signed)
Your physician recommends that you schedule a follow-up appointment in: AS NEEDED  

## 2014-06-10 ENCOUNTER — Other Ambulatory Visit: Payer: Self-pay | Admitting: Dermatology

## 2014-07-21 ENCOUNTER — Other Ambulatory Visit: Payer: Self-pay

## 2014-07-21 DIAGNOSIS — Z1231 Encounter for screening mammogram for malignant neoplasm of breast: Secondary | ICD-10-CM

## 2014-09-07 ENCOUNTER — Ambulatory Visit
Admission: RE | Admit: 2014-09-07 | Discharge: 2014-09-07 | Disposition: A | Discharge status: Home / Self Care | Payer: 59 | Source: Ambulatory Visit

## 2014-09-07 DIAGNOSIS — Z1231 Encounter for screening mammogram for malignant neoplasm of breast: Secondary | ICD-10-CM

## 2014-09-27 ENCOUNTER — Encounter: Payer: Self-pay | Admitting: Gastroenterology

## 2014-09-28 ENCOUNTER — Encounter: Payer: Self-pay | Admitting: Gastroenterology

## 2014-12-15 ENCOUNTER — Other Ambulatory Visit: Payer: Self-pay

## 2015-01-24 ENCOUNTER — Other Ambulatory Visit: Payer: Self-pay | Admitting: Internal Medicine

## 2015-01-24 DIAGNOSIS — R7989 Other specified abnormal findings of blood chemistry: Secondary | ICD-10-CM

## 2015-01-24 DIAGNOSIS — R945 Abnormal results of liver function studies: Principal | ICD-10-CM

## 2015-01-26 ENCOUNTER — Ambulatory Visit
Admission: RE | Admit: 2015-01-26 | Discharge: 2015-01-26 | Disposition: A | Discharge status: Home / Self Care | Payer: 59 | Source: Ambulatory Visit | Attending: Internal Medicine | Admitting: Internal Medicine

## 2015-01-26 DIAGNOSIS — R7989 Other specified abnormal findings of blood chemistry: Secondary | ICD-10-CM

## 2015-01-26 DIAGNOSIS — R945 Abnormal results of liver function studies: Principal | ICD-10-CM

## 2015-06-15 ENCOUNTER — Other Ambulatory Visit: Payer: Self-pay | Admitting: Dermatology

## 2015-08-07 ENCOUNTER — Other Ambulatory Visit: Payer: Self-pay | Admitting: Internal Medicine

## 2015-08-08 NOTE — Addendum Note (Signed)
Addended by: Westley Hummer B on: 08/08/2015 11:03 AM   Modules accepted: Orders

## 2015-08-09 ENCOUNTER — Other Ambulatory Visit: Payer: Self-pay

## 2015-08-09 DIAGNOSIS — Z1231 Encounter for screening mammogram for malignant neoplasm of breast: Secondary | ICD-10-CM

## 2015-09-13 ENCOUNTER — Ambulatory Visit
Admission: RE | Admit: 2015-09-13 | Discharge: 2015-09-13 | Disposition: A | Discharge status: Home / Self Care | Payer: 59 | Source: Ambulatory Visit

## 2015-09-13 DIAGNOSIS — Z1231 Encounter for screening mammogram for malignant neoplasm of breast: Secondary | ICD-10-CM

## 2015-11-30 ENCOUNTER — Encounter: Payer: Self-pay | Admitting: Endocrinology

## 2015-12-12 ENCOUNTER — Other Ambulatory Visit: Payer: Self-pay | Admitting: Internal Medicine

## 2015-12-12 ENCOUNTER — Encounter: Payer: Self-pay | Admitting: Gastroenterology

## 2015-12-12 DIAGNOSIS — E041 Nontoxic single thyroid nodule: Secondary | ICD-10-CM

## 2015-12-20 ENCOUNTER — Ambulatory Visit
Admission: RE | Admit: 2015-12-20 | Discharge: 2015-12-20 | Disposition: A | Discharge status: Home / Self Care | Payer: 59 | Source: Ambulatory Visit | Attending: Internal Medicine | Admitting: Internal Medicine

## 2015-12-20 ENCOUNTER — Other Ambulatory Visit: Payer: 59

## 2015-12-20 DIAGNOSIS — E041 Nontoxic single thyroid nodule: Secondary | ICD-10-CM

## 2016-02-07 ENCOUNTER — Encounter: Payer: 59 | Admitting: Gastroenterology

## 2016-03-23 ENCOUNTER — Ambulatory Visit (AMBULATORY_SURGERY_CENTER): Payer: Self-pay | Admitting: *Deleted

## 2016-03-23 VITALS — Ht 63.5 in | Wt 229.0 lb

## 2016-03-23 DIAGNOSIS — Z1211 Encounter for screening for malignant neoplasm of colon: Secondary | ICD-10-CM

## 2016-03-23 MED ORDER — NA SULFATE-K SULFATE-MG SULF 17.5-3.13-1.6 GM/177ML PO SOLN: ORAL | Status: DC

## 2016-03-23 NOTE — Progress Notes (Signed)
No allergies to eggs or soy. No problems with anesthesia.  Pt given Emmi instructions for colonoscopy  No oxygen use  No diet drug use  

## 2016-04-02 ENCOUNTER — Encounter: Payer: Self-pay | Admitting: Gastroenterology

## 2016-04-05 ENCOUNTER — Encounter: Payer: Self-pay | Admitting: Gastroenterology

## 2016-04-05 ENCOUNTER — Ambulatory Visit (AMBULATORY_SURGERY_CENTER): Payer: 59 | Admitting: Gastroenterology

## 2016-04-05 VITALS — BP 132/66 | HR 80 | Temp 98.6°F | Resp 20 | Ht 63.0 in | Wt 229.0 lb

## 2016-04-05 DIAGNOSIS — Z1211 Encounter for screening for malignant neoplasm of colon: Secondary | ICD-10-CM

## 2016-04-05 DIAGNOSIS — D120 Benign neoplasm of cecum: Secondary | ICD-10-CM | POA: Diagnosis not present

## 2016-04-05 LAB — GLUCOSE, CAPILLARY
GLUCOSE-CAPILLARY: 107 mg/dL — AB (ref 65–99)
Glucose-Capillary: 95 mg/dL (ref 65–99)

## 2016-04-05 MED ORDER — SODIUM CHLORIDE 0.9 % IV SOLN: 500.0000 mL | INTRAVENOUS | Status: DC

## 2016-04-05 NOTE — Patient Instructions (Signed)
YOU HAD AN ENDOSCOPIC PROCEDURE TODAY AT THE [facility]:   Refer to the procedure report that was given to you for any specific questions about what was found during the examination.  If the procedure report does not answer your questions, please call your gastroenterologist to clarify.  If you requested that your care partner not be given the details of your procedure findings, then the procedure report has been included in a sealed envelope for you to review at your convenience later.  YOU SHOULD EXPECT: Some feelings of bloating in the abdomen. Passage of more gas than usual.  Walking can help get rid of the air that was put into your GI tract during the procedure and reduce the bloating. If you had a lower endoscopy (such as a colonoscopy or flexible sigmoidoscopy) you may notice spotting of blood in your stool or on the toilet paper. If you underwent a bowel prep for your procedure, you may not have a normal bowel movement for a few days.  Please Note:  You might notice some irritation and congestion in your nose or some drainage.  This is from the oxygen used during your procedure.  There is no need for concern and it should clear up in a day or so.  SYMPTOMS TO REPORT IMMEDIATELY:   Following lower endoscopy (colonoscopy or flexible sigmoidoscopy):  Excessive amounts of blood in the stool  Significant tenderness or worsening of abdominal pains  Swelling of the abdomen that is new, acute  Fever of 100F or higher   For urgent or emergent issues, a gastroenterologist can be reached at any hour by calling [phone].   DIET: Your first meal following the procedure should be a small meal and then it is ok to progress to your normal diet. Heavy or fried foods are harder to digest and may make you feel nauseous or bloated.  Likewise, meals heavy in dairy and vegetables can increase bloating.  Drink plenty of fluids but you should avoid alcoholic beverages for 24  hours.  ACTIVITY:  You should plan to take it easy for the rest of today and you should NOT DRIVE or use heavy machinery until tomorrow (because of the sedation medicines used during the test).    FOLLOW UP: Our staff will call the number listed on your records the next business day following your procedure to check on you and address any questions or concerns that you may have regarding the information given to you following your procedure. If we do not reach you, we will leave a message.  However, if you are feeling well and you are not experiencing any problems, there is no need to return our call.  We will assume that you have returned to your regular daily activities without incident.  If any biopsies were taken you will be contacted by phone or by letter within the next 1-3 weeks.  Please call us at [phone] if you have not heard about the biopsies in 3 weeks.    SIGNATURES/CONFIDENTIALITY: You and/or your care partner have signed paperwork which will be entered into your electronic medical record.  These signatures attest to the fact that that the information above on your After Visit Summary has been reviewed and is understood.  Full responsibility of the confidentiality of this discharge information lies with you and/or your care-partner.  Polyp-handout given  Repeat colonoscopy will be determined by pathology.   

## 2016-04-05 NOTE — Op Note (Signed)
[facility] Patient Name: [name] Procedure Date: 04/05/2016 1:28 PM MRN: [id number] Endoscopist: Remo Lipps P. Havery Moros , MD Age: 55 Referring MD:  Date of Birth: October 29, 1960 Gender: Female Account #: [id number] Procedure:                Colonoscopy Indications:              Screening for malignant neoplasm in the colon.                            Benign polyps removed 10 years ago. Medicines:                Monitored Anesthesia Care Procedure:                Pre-Anesthesia Assessment:                           - Prior to the procedure, a History and Physical                            was performed, and patient medications and                            allergies were reviewed. The patient's tolerance of                            previous anesthesia was also reviewed. The risks                            and benefits of the procedure and the sedation                            options and risks were discussed with the patient.                            All questions were answered, and informed consent                            was obtained. Prior Anticoagulants: The patient has                            taken no previous anticoagulant or antiplatelet                            agents. ASA Grade Assessment: III - A patient with                            severe systemic disease. After reviewing the risks                            and benefits, the patient was deemed in                            satisfactory condition to undergo the procedure.  After obtaining informed consent, the colonoscope                            was passed under direct vision. Throughout the                            procedure, the patient's blood pressure, pulse, and                            oxygen saturations were monitored continuously. The                            Model CF-HQ190L [id number]) scope was introduced                            through the anus and  advanced to the the cecum,                            identified by appendiceal orifice and ileocecal                            valve. The colonoscopy was performed without                            difficulty. The patient tolerated the procedure                            well. The quality of the bowel preparation was                            adequate. The ileocecal valve, appendiceal orifice,                            and rectum were photographed. Scope In: 1:31:16 PM Scope Out: 1:49:53 PM Scope Withdrawal Time: 0 hours 14 minutes 55 seconds  Total Procedure Duration: 0 hours 18 minutes 37 seconds  Findings:                 The perianal and digital rectal examinations were                            normal.                           A 3 mm polyp was found in the cecum. The polyp was                            flat. The polyp was removed with a cold biopsy                            forceps. Resection and retrieval were complete.                           Anal papilla(e) were hypertrophied.  The exam was otherwise without abnormality. Complications:            No immediate complications. Estimated blood loss:                            Minimal. Estimated Blood Loss:     Estimated blood loss was minimal. Impression:               - One 3 mm polyp in the cecum, removed with a cold                            biopsy forceps. Resected and retrieved.                           - Anal papilla(e) were hypertrophied.                           - The examination was otherwise normal. Recommendation:           - Patient has a contact number available for                            emergencies. The signs and symptoms of potential                            delayed complications were discussed with the                            patient. Return to normal activities tomorrow.                            Written discharge instructions were provided to the                             patient.                           - Resume previous diet.                           - Continue present medications.                           - Await pathology results.                           - Repeat colonoscopy is recommended for                            surveillance. The colonoscopy date will be                            determined after pathology results from today's                            exam become available for review. Remo Lipps P. Armbruster, MD 04/05/2016 1:56:53 PM This report has been signed  electronically.

## 2016-04-05 NOTE — Progress Notes (Signed)
Called to room to assist during endoscopic procedure.  Patient ID and intended procedure confirmed with present staff. Received instructions for my participation in the procedure from the performing physician.  

## 2016-04-05 NOTE — Progress Notes (Signed)
Patient awakening,vss,report to rn 

## 2016-04-06 ENCOUNTER — Telehealth: Payer: Self-pay

## 2016-04-06 NOTE — Telephone Encounter (Signed)
  Follow up Call-  Call back number 04/05/2016  Post procedure Call Back phone  # [phone]  Permission to leave phone message Yes     Patient questions:  Do you have a fever, pain , or abdominal swelling? No. Pain Score  0 *  Have you tolerated food without any problems? Yes.    Have you been able to return to your normal activities? Yes.    Do you have any questions about your discharge instructions: Diet   No. Medications  No. Follow up visit  No.  Do you have questions or concerns about your Care? No.  Actions: * If pain score is 4 or above: No action needed, pain <4.

## 2016-04-10 ENCOUNTER — Encounter: Payer: Self-pay | Admitting: Gastroenterology

## 2016-04-13 ENCOUNTER — Encounter: Payer: Self-pay | Admitting: Gastroenterology

## 2016-05-10 ENCOUNTER — Other Ambulatory Visit: Payer: Self-pay | Admitting: Internal Medicine

## 2016-05-10 ENCOUNTER — Ambulatory Visit
Admission: RE | Admit: 2016-05-10 | Discharge: 2016-05-10 | Disposition: A | Discharge status: Home / Self Care | Payer: 59 | Source: Ambulatory Visit | Attending: Internal Medicine | Admitting: Internal Medicine

## 2016-05-10 DIAGNOSIS — M25562 Pain in left knee: Secondary | ICD-10-CM

## 2016-08-06 ENCOUNTER — Other Ambulatory Visit: Payer: Self-pay | Admitting: Internal Medicine

## 2016-08-06 DIAGNOSIS — Z1231 Encounter for screening mammogram for malignant neoplasm of breast: Secondary | ICD-10-CM

## 2016-09-05 ENCOUNTER — Other Ambulatory Visit: Payer: Self-pay | Admitting: Internal Medicine

## 2016-09-05 DIAGNOSIS — E041 Nontoxic single thyroid nodule: Secondary | ICD-10-CM

## 2016-09-12 ENCOUNTER — Ambulatory Visit
Admission: RE | Admit: 2016-09-12 | Discharge: 2016-09-12 | Disposition: A | Discharge status: Home / Self Care | Payer: 59 | Source: Ambulatory Visit | Attending: Internal Medicine | Admitting: Internal Medicine

## 2016-09-12 DIAGNOSIS — E041 Nontoxic single thyroid nodule: Secondary | ICD-10-CM

## 2016-09-13 ENCOUNTER — Ambulatory Visit
Admission: RE | Admit: 2016-09-13 | Discharge: 2016-09-13 | Disposition: A | Discharge status: Home / Self Care | Payer: 59 | Source: Ambulatory Visit | Attending: Internal Medicine | Admitting: Internal Medicine

## 2016-09-13 DIAGNOSIS — Z1231 Encounter for screening mammogram for malignant neoplasm of breast: Secondary | ICD-10-CM

## 2016-09-14 ENCOUNTER — Ambulatory Visit
Admission: RE | Admit: 2016-09-14 | Discharge: 2016-09-14 | Disposition: A | Discharge status: Home / Self Care | Payer: 59 | Source: Ambulatory Visit | Attending: Internal Medicine | Admitting: Internal Medicine

## 2016-09-14 ENCOUNTER — Other Ambulatory Visit: Payer: Self-pay | Admitting: Internal Medicine

## 2016-09-14 DIAGNOSIS — N63 Unspecified lump in unspecified breast: Secondary | ICD-10-CM

## 2016-09-30 DIAGNOSIS — J22 Unspecified acute lower respiratory infection: Secondary | ICD-10-CM | POA: Diagnosis not present

## 2016-09-30 DIAGNOSIS — R509 Fever, unspecified: Secondary | ICD-10-CM | POA: Diagnosis not present

## 2016-10-18 DIAGNOSIS — R946 Abnormal results of thyroid function studies: Secondary | ICD-10-CM | POA: Diagnosis not present

## 2016-10-18 DIAGNOSIS — E042 Nontoxic multinodular goiter: Secondary | ICD-10-CM | POA: Diagnosis not present

## 2016-10-18 DIAGNOSIS — Z8349 Family history of other endocrine, nutritional and metabolic diseases: Secondary | ICD-10-CM | POA: Diagnosis not present

## 2016-12-12 DIAGNOSIS — L814 Other melanin hyperpigmentation: Secondary | ICD-10-CM | POA: Diagnosis not present

## 2016-12-12 DIAGNOSIS — L821 Other seborrheic keratosis: Secondary | ICD-10-CM | POA: Diagnosis not present

## 2016-12-12 DIAGNOSIS — D225 Melanocytic nevi of trunk: Secondary | ICD-10-CM | POA: Diagnosis not present

## 2017-01-10 DIAGNOSIS — E119 Type 2 diabetes mellitus without complications: Secondary | ICD-10-CM | POA: Diagnosis not present

## 2017-01-10 DIAGNOSIS — E785 Hyperlipidemia, unspecified: Secondary | ICD-10-CM | POA: Diagnosis not present

## 2017-01-10 DIAGNOSIS — Z23 Encounter for immunization: Secondary | ICD-10-CM | POA: Diagnosis not present

## 2017-01-10 DIAGNOSIS — E039 Hypothyroidism, unspecified: Secondary | ICD-10-CM | POA: Diagnosis not present

## 2017-01-10 DIAGNOSIS — Z0000 Encounter for general adult medical examination without abnormal findings: Secondary | ICD-10-CM | POA: Diagnosis not present

## 2017-01-10 LAB — LIPID PANEL
CHOLESTEROL: 144 (ref 0–200)
HDL: 44 (ref 35–70)
LDL CALC: 58
Triglycerides: 212 — AB (ref 40–160)

## 2017-01-10 LAB — BASIC METABOLIC PANEL
BUN: 10 (ref 4–21)
Creatinine: 0.8 (ref 0.5–1.1)
Glucose: 133
POTASSIUM: 4 (ref 3.4–5.3)
SODIUM: 141 (ref 137–147)

## 2017-01-10 LAB — HEPATIC FUNCTION PANEL
ALT: 84 — AB (ref 7–35)
AST: 43 — AB (ref 13–35)
Alkaline Phosphatase: 63 (ref 25–125)
Bilirubin, Total: 0.5

## 2017-01-10 LAB — HEMOGLOBIN A1C: Hgb A1c MFr Bld: 7.5 — AB (ref 4.0–6.0)

## 2017-01-17 ENCOUNTER — Other Ambulatory Visit: Payer: Self-pay | Admitting: Internal Medicine

## 2017-01-17 DIAGNOSIS — R748 Abnormal levels of other serum enzymes: Secondary | ICD-10-CM

## 2017-01-17 DIAGNOSIS — E039 Hypothyroidism, unspecified: Secondary | ICD-10-CM | POA: Diagnosis not present

## 2017-01-17 DIAGNOSIS — I10 Essential (primary) hypertension: Secondary | ICD-10-CM | POA: Diagnosis not present

## 2017-01-25 ENCOUNTER — Other Ambulatory Visit: Payer: 59

## 2017-01-28 DIAGNOSIS — J45909 Unspecified asthma, uncomplicated: Secondary | ICD-10-CM | POA: Diagnosis not present

## 2017-01-28 LAB — PULMONARY FUNCTION TEST

## 2017-02-19 DIAGNOSIS — E119 Type 2 diabetes mellitus without complications: Secondary | ICD-10-CM | POA: Diagnosis not present

## 2017-04-24 DIAGNOSIS — J45909 Unspecified asthma, uncomplicated: Secondary | ICD-10-CM | POA: Diagnosis not present

## 2017-04-24 DIAGNOSIS — K219 Gastro-esophageal reflux disease without esophagitis: Secondary | ICD-10-CM | POA: Diagnosis not present

## 2017-04-30 DIAGNOSIS — E1139 Type 2 diabetes mellitus with other diabetic ophthalmic complication: Secondary | ICD-10-CM | POA: Diagnosis not present

## 2017-04-30 DIAGNOSIS — E119 Type 2 diabetes mellitus without complications: Secondary | ICD-10-CM | POA: Diagnosis not present

## 2017-04-30 LAB — HEMOGLOBIN A1C: HEMOGLOBIN A1C: 6.2 — AB (ref 4.0–6.0)

## 2017-04-30 LAB — BASIC METABOLIC PANEL
GLUCOSE: 115
POTASSIUM: 3.9 (ref 3.4–5.3)
Sodium: 139 (ref 137–147)

## 2017-06-12 DIAGNOSIS — H524 Presbyopia: Secondary | ICD-10-CM | POA: Diagnosis not present

## 2017-06-12 DIAGNOSIS — E119 Type 2 diabetes mellitus without complications: Secondary | ICD-10-CM | POA: Diagnosis not present

## 2017-07-08 DIAGNOSIS — Z23 Encounter for immunization: Secondary | ICD-10-CM | POA: Diagnosis not present

## 2017-07-17 DIAGNOSIS — L821 Other seborrheic keratosis: Secondary | ICD-10-CM | POA: Diagnosis not present

## 2017-07-17 DIAGNOSIS — D2271 Melanocytic nevi of right lower limb, including hip: Secondary | ICD-10-CM | POA: Diagnosis not present

## 2017-07-17 DIAGNOSIS — Z85820 Personal history of malignant melanoma of skin: Secondary | ICD-10-CM | POA: Diagnosis not present

## 2017-07-17 DIAGNOSIS — D485 Neoplasm of uncertain behavior of skin: Secondary | ICD-10-CM | POA: Diagnosis not present

## 2017-07-17 DIAGNOSIS — D225 Melanocytic nevi of trunk: Secondary | ICD-10-CM | POA: Diagnosis not present

## 2017-08-23 ENCOUNTER — Other Ambulatory Visit: Payer: Self-pay | Admitting: Internal Medicine

## 2017-08-23 DIAGNOSIS — E042 Nontoxic multinodular goiter: Secondary | ICD-10-CM | POA: Diagnosis not present

## 2017-08-23 DIAGNOSIS — Z1231 Encounter for screening mammogram for malignant neoplasm of breast: Secondary | ICD-10-CM

## 2017-08-23 LAB — TSH: TSH: 1.87 (ref 0.41–5.90)

## 2017-09-24 ENCOUNTER — Ambulatory Visit: Payer: 59

## 2017-10-02 DIAGNOSIS — F419 Anxiety disorder, unspecified: Secondary | ICD-10-CM | POA: Insufficient documentation

## 2017-10-02 DIAGNOSIS — E1121 Type 2 diabetes mellitus with diabetic nephropathy: Secondary | ICD-10-CM

## 2017-10-02 DIAGNOSIS — J45909 Unspecified asthma, uncomplicated: Secondary | ICD-10-CM | POA: Insufficient documentation

## 2017-10-02 DIAGNOSIS — E785 Hyperlipidemia, unspecified: Secondary | ICD-10-CM | POA: Insufficient documentation

## 2017-10-02 DIAGNOSIS — E119 Type 2 diabetes mellitus without complications: Secondary | ICD-10-CM | POA: Diagnosis not present

## 2017-10-02 DIAGNOSIS — R05 Cough: Secondary | ICD-10-CM | POA: Diagnosis not present

## 2017-10-02 DIAGNOSIS — J019 Acute sinusitis, unspecified: Secondary | ICD-10-CM | POA: Diagnosis not present

## 2017-11-01 DIAGNOSIS — E039 Hypothyroidism, unspecified: Secondary | ICD-10-CM | POA: Diagnosis not present

## 2017-11-01 DIAGNOSIS — Z23 Encounter for immunization: Secondary | ICD-10-CM | POA: Diagnosis not present

## 2017-11-01 DIAGNOSIS — E1139 Type 2 diabetes mellitus with other diabetic ophthalmic complication: Secondary | ICD-10-CM | POA: Diagnosis not present

## 2017-11-01 LAB — HEMOGLOBIN A1C: Hgb A1c MFr Bld: 6.6 — AB (ref 4.0–6.0)

## 2017-11-04 ENCOUNTER — Other Ambulatory Visit: Payer: Self-pay | Admitting: Internal Medicine

## 2017-11-04 ENCOUNTER — Ambulatory Visit
Admission: RE | Admit: 2017-11-04 | Discharge: 2017-11-04 | Disposition: A | Discharge status: Home / Self Care | Payer: 59 | Source: Ambulatory Visit | Attending: Internal Medicine | Admitting: Internal Medicine

## 2017-11-04 DIAGNOSIS — Z1231 Encounter for screening mammogram for malignant neoplasm of breast: Secondary | ICD-10-CM

## 2017-11-11 DIAGNOSIS — J0100 Acute maxillary sinusitis, unspecified: Secondary | ICD-10-CM | POA: Diagnosis not present

## 2017-11-11 DIAGNOSIS — E119 Type 2 diabetes mellitus without complications: Secondary | ICD-10-CM | POA: Diagnosis not present

## 2017-12-18 ENCOUNTER — Other Ambulatory Visit: Payer: Self-pay | Admitting: Internal Medicine

## 2017-12-18 DIAGNOSIS — E042 Nontoxic multinodular goiter: Secondary | ICD-10-CM

## 2017-12-18 DIAGNOSIS — Z8349 Family history of other endocrine, nutritional and metabolic diseases: Secondary | ICD-10-CM | POA: Diagnosis not present

## 2017-12-18 DIAGNOSIS — R946 Abnormal results of thyroid function studies: Secondary | ICD-10-CM | POA: Diagnosis not present

## 2018-01-14 DIAGNOSIS — L821 Other seborrheic keratosis: Secondary | ICD-10-CM | POA: Diagnosis not present

## 2018-01-14 DIAGNOSIS — D485 Neoplasm of uncertain behavior of skin: Secondary | ICD-10-CM | POA: Diagnosis not present

## 2018-01-14 DIAGNOSIS — D225 Melanocytic nevi of trunk: Secondary | ICD-10-CM | POA: Diagnosis not present

## 2018-02-17 ENCOUNTER — Ambulatory Visit
Admission: RE | Admit: 2018-02-17 | Discharge: 2018-02-17 | Disposition: A | Discharge status: Home / Self Care | Payer: 59 | Source: Ambulatory Visit | Attending: Internal Medicine | Admitting: Internal Medicine

## 2018-02-17 DIAGNOSIS — E042 Nontoxic multinodular goiter: Secondary | ICD-10-CM

## 2018-03-17 DIAGNOSIS — Z0000 Encounter for general adult medical examination without abnormal findings: Secondary | ICD-10-CM | POA: Diagnosis not present

## 2018-03-17 DIAGNOSIS — E785 Hyperlipidemia, unspecified: Secondary | ICD-10-CM | POA: Diagnosis not present

## 2018-03-17 LAB — BASIC METABOLIC PANEL
BUN: 16 (ref 4–21)
Creatinine: 1.1 (ref <=1.1)
GLUCOSE: 137
Potassium: 4 (ref 3.4–5.3)
SODIUM: 136 — AB (ref 137–147)

## 2018-03-17 LAB — CBC AND DIFFERENTIAL
HEMATOCRIT: 33 — AB (ref 36–46)
Hemoglobin: 11.2 — AB (ref 12.0–16.0)
Platelets: 319 (ref 150–399)
WBC: 7.9

## 2018-03-17 LAB — LIPID PANEL
Cholesterol: 125 (ref 0–200)
HDL: 37 (ref 35–70)
LDL CALC: 53
Triglycerides: 179 — AB (ref 40–160)

## 2018-03-17 LAB — HEPATIC FUNCTION PANEL
ALT: 39 — AB (ref 7–35)
AST: 24 (ref 13–35)
Alkaline Phosphatase: 50 (ref 25–125)
Bilirubin, Total: 0.4

## 2018-03-17 LAB — MICROALBUMIN, URINE: Microalb, Ur: 0.81

## 2018-03-17 LAB — VITAMIN B12: Vitamin B-12: 252

## 2018-03-17 LAB — HEMOGLOBIN A1C: Hgb A1c MFr Bld: 7.1 — AB (ref 4.0–6.0)

## 2018-04-09 DIAGNOSIS — D1030 Benign neoplasm of unspecified part of mouth: Secondary | ICD-10-CM | POA: Diagnosis not present

## 2018-04-22 DIAGNOSIS — M62838 Other muscle spasm: Secondary | ICD-10-CM | POA: Diagnosis not present

## 2018-04-22 DIAGNOSIS — M6281 Muscle weakness (generalized): Secondary | ICD-10-CM | POA: Diagnosis not present

## 2018-04-22 DIAGNOSIS — R102 Pelvic and perineal pain: Secondary | ICD-10-CM | POA: Diagnosis not present

## 2018-04-25 DIAGNOSIS — G4700 Insomnia, unspecified: Secondary | ICD-10-CM | POA: Diagnosis not present

## 2018-04-25 DIAGNOSIS — E1169 Type 2 diabetes mellitus with other specified complication: Secondary | ICD-10-CM | POA: Diagnosis not present

## 2018-04-29 DIAGNOSIS — Z780 Asymptomatic menopausal state: Secondary | ICD-10-CM | POA: Diagnosis not present

## 2018-05-08 ENCOUNTER — Emergency Department (HOSPITAL_COMMUNITY): Payer: 59

## 2018-05-08 ENCOUNTER — Encounter (HOSPITAL_COMMUNITY): Payer: Self-pay | Admitting: Emergency Medicine

## 2018-05-08 ENCOUNTER — Other Ambulatory Visit: Payer: Self-pay

## 2018-05-08 ENCOUNTER — Observation Stay (HOSPITAL_COMMUNITY)
Admission: EM | Admit: 2018-05-08 | Discharge: 2018-05-10 | Disposition: A | Discharge status: Home / Self Care | Payer: 59 | Attending: Internal Medicine | Admitting: Internal Medicine

## 2018-05-08 DIAGNOSIS — E876 Hypokalemia: Secondary | ICD-10-CM | POA: Insufficient documentation

## 2018-05-08 DIAGNOSIS — E1121 Type 2 diabetes mellitus with diabetic nephropathy: Secondary | ICD-10-CM

## 2018-05-08 DIAGNOSIS — I10 Essential (primary) hypertension: Secondary | ICD-10-CM | POA: Insufficient documentation

## 2018-05-08 DIAGNOSIS — E785 Hyperlipidemia, unspecified: Secondary | ICD-10-CM | POA: Insufficient documentation

## 2018-05-08 DIAGNOSIS — Z885 Allergy status to narcotic agent status: Secondary | ICD-10-CM | POA: Diagnosis not present

## 2018-05-08 DIAGNOSIS — E871 Hypo-osmolality and hyponatremia: Secondary | ICD-10-CM | POA: Insufficient documentation

## 2018-05-08 DIAGNOSIS — E11649 Type 2 diabetes mellitus with hypoglycemia without coma: Secondary | ICD-10-CM | POA: Diagnosis not present

## 2018-05-08 DIAGNOSIS — N179 Acute kidney failure, unspecified: Principal | ICD-10-CM | POA: Diagnosis present

## 2018-05-08 DIAGNOSIS — E039 Hypothyroidism, unspecified: Secondary | ICD-10-CM | POA: Diagnosis present

## 2018-05-08 DIAGNOSIS — R1111 Vomiting without nausea: Secondary | ICD-10-CM | POA: Diagnosis not present

## 2018-05-08 DIAGNOSIS — K760 Fatty (change of) liver, not elsewhere classified: Secondary | ICD-10-CM

## 2018-05-08 DIAGNOSIS — Z888 Allergy status to other drugs, medicaments and biological substances status: Secondary | ICD-10-CM | POA: Diagnosis not present

## 2018-05-08 DIAGNOSIS — Z79899 Other long term (current) drug therapy: Secondary | ICD-10-CM | POA: Diagnosis not present

## 2018-05-08 DIAGNOSIS — Z7982 Long term (current) use of aspirin: Secondary | ICD-10-CM | POA: Diagnosis not present

## 2018-05-08 DIAGNOSIS — Z7984 Long term (current) use of oral hypoglycemic drugs: Secondary | ICD-10-CM | POA: Diagnosis not present

## 2018-05-08 DIAGNOSIS — R112 Nausea with vomiting, unspecified: Secondary | ICD-10-CM

## 2018-05-08 DIAGNOSIS — E860 Dehydration: Secondary | ICD-10-CM

## 2018-05-08 DIAGNOSIS — R55 Syncope and collapse: Secondary | ICD-10-CM | POA: Diagnosis present

## 2018-05-08 DIAGNOSIS — K429 Umbilical hernia without obstruction or gangrene: Secondary | ICD-10-CM | POA: Diagnosis not present

## 2018-05-08 DIAGNOSIS — R197 Diarrhea, unspecified: Secondary | ICD-10-CM

## 2018-05-08 HISTORY — DX: Fatty (change of) liver, not elsewhere classified: K76.0

## 2018-05-08 LAB — CBC
HEMATOCRIT: 36.5 % (ref 36.0–46.0)
HEMOGLOBIN: 11.8 g/dL — AB (ref 12.0–15.0)
MCH: 30 pg (ref 26.0–34.0)
MCHC: 32.3 g/dL (ref 30.0–36.0)
MCV: 92.9 fL (ref 78.0–100.0)
Platelets: 359 10*3/uL (ref 150–400)
RBC: 3.93 MIL/uL (ref 3.87–5.11)
RDW: 12.4 % (ref 11.5–15.5)
WBC: 9.9 10*3/uL (ref 4.0–10.5)

## 2018-05-08 LAB — GLUCOSE, CAPILLARY
Glucose-Capillary: 75 mg/dL (ref 70–99)
Glucose-Capillary: 84 mg/dL (ref 70–99)
Glucose-Capillary: 85 mg/dL (ref 70–99)

## 2018-05-08 LAB — COMPREHENSIVE METABOLIC PANEL
ALBUMIN: 3.4 g/dL — AB (ref 3.5–5.0)
ALT: 26 U/L (ref 0–44)
ANION GAP: 11 (ref 5–15)
AST: 14 U/L — ABNORMAL LOW (ref 15–41)
Alkaline Phosphatase: 58 U/L (ref 38–126)
BILIRUBIN TOTAL: 0.7 mg/dL (ref 0.3–1.2)
BUN: 24 mg/dL — AB (ref 6–20)
CHLORIDE: 96 mmol/L — AB (ref 98–111)
CO2: 23 mmol/L (ref 22–32)
Calcium: 7.8 mg/dL — ABNORMAL LOW (ref 8.9–10.3)
Creatinine, Ser: 1.61 mg/dL — ABNORMAL HIGH (ref 0.44–1.00)
GFR calc Af Amer: 41 mL/min — ABNORMAL LOW (ref >60)
GFR calc non Af Amer: 35 mL/min — ABNORMAL LOW (ref >60)
GLUCOSE: 166 mg/dL — AB (ref 70–99)
POTASSIUM: 3.2 mmol/L — AB (ref 3.5–5.1)
SODIUM: 130 mmol/L — AB (ref 135–145)
TOTAL PROTEIN: 6.6 g/dL (ref 6.5–8.1)

## 2018-05-08 LAB — URINALYSIS, ROUTINE W REFLEX MICROSCOPIC
Bilirubin Urine: NEGATIVE
Glucose, UA: NEGATIVE mg/dL
Ketones, ur: NEGATIVE mg/dL
NITRITE: NEGATIVE
PROTEIN: NEGATIVE mg/dL
Specific Gravity, Urine: 1.019 (ref 1.005–1.030)
pH: 5 (ref 5.0–8.0)

## 2018-05-08 LAB — MAGNESIUM: MAGNESIUM: 1.4 mg/dL — AB (ref 1.7–2.4)

## 2018-05-08 LAB — I-STAT BETA HCG BLOOD, ED (MC, WL, AP ONLY): HCG, QUANTITATIVE: 5.5 m[IU]/mL — AB (ref <5)

## 2018-05-08 LAB — LIPASE, BLOOD: LIPASE: 32 U/L (ref 11–51)

## 2018-05-08 LAB — I-STAT TROPONIN, ED: Troponin i, poc: 0 ng/mL (ref 0.00–0.08)

## 2018-05-08 LAB — PHOSPHORUS: PHOSPHORUS: 2.1 mg/dL — AB (ref 2.5–4.6)

## 2018-05-08 MED ORDER — SODIUM CHLORIDE 0.9 % IV SOLN
INTRAVENOUS | Status: DC
  Administered 2018-05-08 – 2018-05-09 (×2): via INTRAVENOUS

## 2018-05-08 MED ORDER — ONDANSETRON HCL 4 MG PO TABS: 4.0000 mg | ORAL_TABLET | Freq: Four times a day (QID) | ORAL | Status: DC | PRN

## 2018-05-08 MED ORDER — OXYBUTYNIN CHLORIDE 5 MG PO TABS
5.0000 mg | ORAL_TABLET | Freq: Every day | ORAL | Status: DC
  Administered 2018-05-10: 5 mg via ORAL
  Filled 2018-05-08 (×3): qty 1

## 2018-05-08 MED ORDER — ASPIRIN EC 81 MG PO TBEC
81.0000 mg | DELAYED_RELEASE_TABLET | Freq: Every day | ORAL | Status: DC
  Administered 2018-05-10: 81 mg via ORAL
  Filled 2018-05-08 (×2): qty 1

## 2018-05-08 MED ORDER — MONTELUKAST SODIUM 10 MG PO TABS
10.0000 mg | ORAL_TABLET | Freq: Every day | ORAL | Status: DC
Start: 2018-05-08 — End: 2018-05-10
  Administered 2018-05-08 – 2018-05-09 (×2): 10 mg via ORAL
  Filled 2018-05-08 (×2): qty 1

## 2018-05-08 MED ORDER — SODIUM CHLORIDE 0.9 % IV BOLUS
1000.0000 mL | Freq: Once | INTRAVENOUS | Status: AC
  Administered 2018-05-08: 1000 mL via INTRAVENOUS

## 2018-05-08 MED ORDER — ONDANSETRON 4 MG PO TBDP
4.0000 mg | ORAL_TABLET | Freq: Once | ORAL | Status: AC | PRN
  Administered 2018-05-08: 4 mg via ORAL
  Filled 2018-05-08: qty 1

## 2018-05-08 MED ORDER — ONDANSETRON HCL 4 MG/2ML IJ SOLN
4.0000 mg | Freq: Four times a day (QID) | INTRAMUSCULAR | Status: DC | PRN
  Administered 2018-05-08: 4 mg via INTRAVENOUS
  Filled 2018-05-08: qty 2

## 2018-05-08 MED ORDER — INSULIN ASPART 100 UNIT/ML ~~LOC~~ SOLN: 0.0000 [IU] | Freq: Four times a day (QID) | SUBCUTANEOUS | Status: DC

## 2018-05-08 MED ORDER — DICYCLOMINE HCL 10 MG/ML IM SOLN
20.0000 mg | Freq: Once | INTRAMUSCULAR | Status: AC
  Administered 2018-05-08: 20 mg via INTRAMUSCULAR
  Filled 2018-05-08: qty 2

## 2018-05-08 MED ORDER — ONDANSETRON HCL 4 MG/2ML IJ SOLN
4.0000 mg | Freq: Once | INTRAMUSCULAR | Status: AC
  Administered 2018-05-08: 4 mg via INTRAVENOUS
  Filled 2018-05-08: qty 2

## 2018-05-08 MED ORDER — VENLAFAXINE HCL ER 75 MG PO CP24
75.0000 mg | ORAL_CAPSULE | Freq: Every day | ORAL | Status: DC
  Administered 2018-05-10: 75 mg via ORAL
  Filled 2018-05-08 (×3): qty 1

## 2018-05-08 MED ORDER — LORATADINE 10 MG PO TABS
10.0000 mg | ORAL_TABLET | Freq: Every day | ORAL | Status: DC
  Administered 2018-05-09 – 2018-05-10 (×2): 10 mg via ORAL
  Filled 2018-05-08 (×2): qty 1

## 2018-05-08 MED ORDER — FLUTICASONE PROPIONATE HFA 220 MCG/ACT IN AERO: 1.0000 | INHALATION_SPRAY | Freq: Two times a day (BID) | RESPIRATORY_TRACT | Status: DC

## 2018-05-08 MED ORDER — ATORVASTATIN CALCIUM 20 MG PO TABS
20.0000 mg | ORAL_TABLET | Freq: Every day | ORAL | Status: DC
  Administered 2018-05-09 – 2018-05-10 (×2): 20 mg via ORAL
  Filled 2018-05-08 (×2): qty 1

## 2018-05-08 MED ORDER — ACETAMINOPHEN 325 MG PO TABS: 650.0000 mg | ORAL_TABLET | Freq: Four times a day (QID) | ORAL | Status: DC | PRN

## 2018-05-08 MED ORDER — TRAZODONE HCL 50 MG PO TABS
100.0000 mg | ORAL_TABLET | Freq: Every day | ORAL | Status: DC
  Administered 2018-05-08 – 2018-05-09 (×2): 100 mg via ORAL
  Filled 2018-05-08 (×2): qty 2

## 2018-05-08 MED ORDER — LEVOTHYROXINE SODIUM 50 MCG PO TABS
50.0000 ug | ORAL_TABLET | Freq: Every day | ORAL | Status: DC
  Administered 2018-05-09 – 2018-05-10 (×2): 50 ug via ORAL
  Filled 2018-05-08 (×2): qty 1

## 2018-05-08 MED ORDER — LEVOTHYROXINE SODIUM 100 MCG IV SOLR: 25.0000 ug | Freq: Every day | INTRAVENOUS | Status: DC

## 2018-05-08 MED ORDER — POTASSIUM CHLORIDE 10 MEQ/100ML IV SOLN
10.0000 meq | INTRAVENOUS | Status: AC
  Administered 2018-05-08 (×4): 10 meq via INTRAVENOUS
  Filled 2018-05-08 (×4): qty 100

## 2018-05-08 MED ORDER — BUDESONIDE 0.5 MG/2ML IN SUSP
1.0000 mg | Freq: Two times a day (BID) | RESPIRATORY_TRACT | Status: DC
  Filled 2018-05-08: qty 4

## 2018-05-08 MED ORDER — ACETAMINOPHEN 650 MG RE SUPP: 650.0000 mg | Freq: Four times a day (QID) | RECTAL | Status: DC | PRN

## 2018-05-08 NOTE — H&P (Signed)
History and Physical  [name] [id number] DOB: 1961-05-06 DOA: 05/08/2018  PCP: Leeroy Cha, MD   Chief Complaint: vomiting  HPI:  57 year old woman PMH diabetes mellitus type 2 presented to the emergency department with 6-day history of nausea, vomiting, diarrhea that began after taking Trulicity.  Admitted for the same, AKI.  Symptoms began after taking Trulicity days ago, initially with nausea, followed within 24 hours by severe nausea, vomiting, diarrhea, multiple episodes per day, intractable, unable to tolerate any p.o. intake.  Associated with significant abdominal bloating and severe generalized pain intermittent in nature without specific aggravating factors, relieved by positional change.  ED Course: Treated with Bentyl, Zofran, 3 L IV fluid  Review of Systems:  Negative for fever, visual changes, sore throat, rash, new muscle aches, chest pain, SOB, dysuria, bleeding  Past Medical History:  Diagnosis Date  . Allergic rhinitis   . Allergy   . Anxiety   . Asthma   . Depression   . Diabetes mellitus ([place])   . GERD (gastroesophageal reflux disease)   . Hepatic steatosis 05/08/2018  . Hx of echocardiogram    a. Echo (7/15): Mild focal basal hypertrophy of the septum, EF 55-60%, normal wall motion, grade 1 diastolic dysfunction  . Hyperlipidemia   . Hypertension   . Hypothyroid   . Melanoma ([place]) 2015   back  . Seizure disorder ([place])   . Seizures ([place])    seizures caused by antidepressants; not seizure in 12 years  . Sialadenitis    seen by Dr Lucia Gaskins in the past  . Sleep apnea    lost 40 pounds; does not use cpap    Past Surgical History:  Procedure Laterality Date  . ABDOMINAL HYSTERECTOMY  1998  . BILE DUCT EXPLORATION  1995  . C-section x3    . CHOLECYSTECTOMY  1995  . LAPAROSCOPIC LYSIS INTESTINAL ADHESIONS  2001, 2000  . LAPAROSCOPIC OOPHERECTOMY  1999  . MELANOMA EXCISION  2015   back   . TUBAL LIGATION  1998     reports that she  has never smoked. She has never used smokeless tobacco. She reports that she drinks alcohol. She reports that she does not use drugs. Mobility: Ambulatory  Allergies  Allergen Reactions  . Lexapro [Escitalopram] Other (See Comments)    Causes grand mal seizures; all antidepressants  . Morphine   . Morphine Sulfate Rash    Family History  Problem Relation Age of Onset  . Alcohol abuse Unknown   . Autism Daughter        and vitamin K deficiency  . Coronary artery disease Father        MI in his 13s  . Diabetes Unknown        multiple family members  . Colon cancer Neg Hx      Prior to Admission medications   Medication Sig Start Date End Date Taking? Authorizing Provider  albuterol (PROVENTIL HFA;VENTOLIN HFA) 108 (90 BASE) MCG/ACT inhaler Inhale 1 puff into the lungs every 6 (six) hours as needed for wheezing or shortness of breath.   Yes [provider]  aspirin 81 MG tablet Take 81 mg by mouth daily.   Yes [provider]  atorvastatin (LIPITOR) 20 MG tablet Take 20 mg by mouth daily. 04/11/18  Yes [provider]  fluticasone (FLOVENT HFA) 220 MCG/ACT inhaler Inhale 1 puff into the lungs 2 (two) times daily.   Yes [provider]  levothyroxine (SYNTHROID, LEVOTHROID) 50 MCG tablet Take 50 mcg  by mouth daily. 04/11/18  Yes [provider]  loratadine (CLARITIN) 10 MG tablet Take 10 mg by mouth daily.   Yes [provider]  metFORMIN (GLUCOPHAGE) 1000 MG tablet Take 1,000 mg by mouth 2 (two) times daily. 03/08/18  Yes [provider]  montelukast (SINGULAIR) 10 MG tablet Take 10 mg by mouth at bedtime.    Yes [provider]  olmesartan (BENICAR) 5 MG tablet Take 5 mg by mouth daily. 04/06/18  Yes [provider]  omeprazole (PRILOSEC) 40 MG capsule Take 40 mg by mouth daily. 10/09/13  Yes [provider]  ONGLYZA 5 MG TABS tablet Take 5 mg by mouth daily. 04/20/18  Yes [provider]    oxybutynin (DITROPAN) 5 MG tablet Take 5 mg by mouth daily. 05/05/18  Yes [provider]  traZODone (DESYREL) 50 MG tablet Take 100 mg by mouth at bedtime. 05/07/18  Yes [provider]  triamterene-hydrochlorothiazide (MAXZIDE-25) 37.5-25 MG per tablet Take 1 tablet by mouth daily.   Yes [provider]  TRULICITY 5.17 OH/6.0VP SOPN Inject 0.75 mg into the skin every 7 (seven) days. On Friday 04/02/18  Yes [provider]  venlafaxine XR (EFFEXOR-XR) 75 MG 24 hr capsule Take 75 mg by mouth daily. 04/11/18  Yes [provider]  rosuvastatin (CRESTOR) 10 MG tablet Take 1 tablet (10 mg total) by mouth daily. Patient not taking: Reported on 05/08/2018 12/18/10   [name], DO    Physical Exam: Vitals:   05/08/18 0800 05/08/18 0815  BP: 127/75 133/67  Pulse: 92   Resp: 14 17  Temp:    SpO2: 94%     Constitutional:   . Appears calm, mildly uncomfortable, non-toxic Eyes:  . pupils and irises appear normal . Normal lids and conjunctivae ENMT:  . grossly normal hearing  . Lips appear normal Neck:  . neck appears normal, no masses . no thyromegaly Respiratory:  . CTA bilaterally, no w/r/r.  . Respiratory effort normal.  Cardiovascular:  . RRR, no m/r/g . No LE extremity edema   Abdomen:  . Soft, significantly distended, mild generalized tenderness. Musculoskeletal:  . Digits/nails BUE: no clubbing, cyanosis, petechiae, infection . RUE, LUE   o strength and tone normal, no atrophy, no abnormal movements Skin:  . No rashes, lesions, ulcers . palpation of skin: no induration or nodules Psychiatric:  . Mental status o Mood, affect appropriate . judgment and insight appear intact    I have personally reviewed following labs and imaging studies  Labs:   Sodium 130, potassium 3.2, BUN 24, creatinine 1.61, LFTs unremarkable, troponin negative  CBC unremarkable  Urinalysis unremarkable  Urine pregnancy modestly elevated,  however patient is status post TAH/BSO  Imaging studies:   CT renal stone study showed liquid stool throughout the colon consistent with possible enteritis.  No obstruction or inflammation or bowel wall thickening.  Mild left perinephric edema without urolithiasis can be seen with UTI recently passed stone.  Hepatomegaly and hepatic steatosis.  Medical tests:   EKG independently reviewed: Sinus tachycardia, no acute changes   Active Problems:   SYNCOPE   Hypothyroidism   AKI (acute kidney injury) ([place])   DM type 2 (diabetes mellitus, type 2) ([facility])   Hepatic steatosis   Assessment/Plan N/V/D, suspect adverse drug reaction Trulicity, no recent antibiotics, however consider viral gastroenteritis, C. difficile colitis.  CT suggested diarrheal process, enteritis.  No evidence of complicating features. --withhold Trulicity.  --Aggressive IV fluids, antiemetics, n.p.o., advance diet  as tolerated --Contact precautions, rule out C. Diff; check GI pathogen panel   Syncope secondary to dehydration --EKG unremarkable.  No further evaluation suggested.  AKI --Continue IV fluids.  Strict I/O.  Check BMP in a.m.  Hypokalemia --Replete.  Check serum magnesium.  Diabetes mellitus type 2.  Anion gap within normal limits.  Random blood sugar less than 200. --Withhold metformin.  Sliding scale insulin.  Hypothyroidism. --Continue levothyroxine.  Hepatomegaly, hepatic steatosis seen on CT. --Follow-up as an outpatient.  Severity of Illness: The appropriate patient status for this patient is OBSERVATION. Observation status is judged to be reasonable and necessary in order to provide the required intensity of service to ensure the patient's safety. The patient's presenting symptoms, physical exam findings, and initial radiographic and laboratory data in the context of their medical condition is felt to place them at decreased risk for further clinical deterioration. Furthermore, it is  anticipated that the patient will be medically stable for discharge from the hospital within 2 midnights of admission.   DVT prophylaxis:SCDs Code Status: Full code Family Communication: son at bedside with pt's permission Consults called: none    Time spent: 64 minutes  Murray Hodgkins, MD  [facility] Direct contact: [phone] --Via [place]  --[email or web address]; password [email or web address]  7PM-7AM contact night coverage as above  05/08/2018, 9:44 AM

## 2018-05-08 NOTE — ED Triage Notes (Signed)
Pt presents with ongoing n/v/d x 6 days; pt states got worse today with a couple episodes of syncope while trying to go to the restroom; pts son reports hypotension; pt started trulicity and isnt sure if this is the cause

## 2018-05-08 NOTE — ED Notes (Signed)
Patient transported to CT 

## 2018-05-08 NOTE — ED Notes (Signed)
[facility] called @ 1002-per Cindy, R-called by Kimaya Whitlatch-Transfer to [phone]

## 2018-05-08 NOTE — ED Provider Notes (Signed)
[facility] EMERGENCY DEPARTMENT Provider Note   CSN: [id number] Arrival date & time: 05/08/18  0002     History   Chief Complaint Chief Complaint  Patient presents with  . Emesis  . Diarrhea  . Dizziness  . Abdominal Pain  . Loss of Consciousness    HPI [name] is a 57 y.o. female.  The history is provided by the patient and a relative.  Emesis   This is a new problem. The current episode started more than 2 days ago. Episode frequency: 3 to 5 a day since Friday. The problem has not changed since onset.The emesis has an appearance of stomach contents. There has been no fever. Associated symptoms include diarrhea. Pertinent negatives include no chills, no cough, no sweats and no URI.  Diarrhea   This is a new problem. The current episode started more than 2 days ago. The problem occurs more than 10 times per day. The problem has been gradually worsening. The stool consistency is described as watery. There has been no fever. Associated symptoms include vomiting. Pertinent negatives include no chills, no sweats, no URI and no cough. She has tried nothing for the symptoms. The treatment provided no relief. Her past medical history does not include short gut syndrome or bowel resection.  Loss of Consciousness   This is a new problem. The current episode started 6 to 12 hours ago. The problem occurs constantly. The problem has been resolved. She lost consciousness for a period of less than one minute. The problem is associated with standing up. Associated symptoms include light-headedness, nausea and vomiting. Pertinent negatives include chest pain, diaphoresis, focal weakness and palpitations. She has tried nothing for the symptoms. The treatment provided no relief. Her past medical history is significant for DM.    Past Medical History:  Diagnosis Date  . Allergic rhinitis   . Allergy   . Anxiety   . Asthma   . Depression   . Diabetes mellitus ([place])   .  GERD (gastroesophageal reflux disease)   . Hx of echocardiogram    a. Echo (7/15): Mild focal basal hypertrophy of the septum, EF 55-60%, normal wall motion, grade 1 diastolic dysfunction  . Hyperlipidemia   . Hypertension   . Hypothyroid   . Melanoma ([place]) 2015   back  . Seizure disorder ([place])   . Seizures ([place])    seizures caused by antidepressants; not seizure in 12 years  . Sialadenitis    seen by Dr Lucia Gaskins in the past  . Sleep apnea    lost 40 pounds; does not use cpap    Patient Active Problem List   Diagnosis Date Noted  . Chest pain 10/22/2013  . Dysphagia, unspecified(787.20) 04/01/2013  . Unspecified hypothyroidism 04/01/2013  . Thyroid nodule 03/30/2013  . INSOMNIA, CHRONIC 01/02/2010  . EDEMA 01/02/2010  . SECONDARY MALIGNANT NEOPLASM OF SKIN 11/05/2007  . SYNCOPE 11/05/2007  . SIALADENITIS 11/03/2007  . ADJUSTMENT DISORDER WITH ANXIETY 10/27/2007  . DYSPLASTIC NEVUS 09/19/2007  . HYPERTENSION 09/19/2007  . Other and unspecified hyperlipidemia 08/25/2007  . OTHER ABNORMAL GLUCOSE 08/25/2007  . ANEMIA 05/26/2007  . ANXIETY 05/22/2007  . ALCOHOL ABUSE 05/22/2007  . DEPRESSION 05/22/2007  . ALLERGIC RHINITIS 05/22/2007  . ASTHMA 05/22/2007  . SEIZURE DISORDER 05/22/2007  . ALLERGY, HX OF 05/22/2007    Past Surgical History:  Procedure Laterality Date  . ABDOMINAL HYSTERECTOMY  1998  . BILE DUCT EXPLORATION  1995  . CHOLECYSTECTOMY  1995  .  LAPAROSCOPIC LYSIS INTESTINAL ADHESIONS  2001, 2000  . LAPAROSCOPIC OOPHERECTOMY  1999  . MELANOMA EXCISION  2015   back   . TUBAL LIGATION  1998     OB History   None      Home Medications    Prior to Admission medications   Medication Sig Start Date End Date Taking? Authorizing Provider  albuterol (PROVENTIL HFA;VENTOLIN HFA) 108 (90 BASE) MCG/ACT inhaler Inhale 1 puff into the lungs every 6 (six) hours as needed for wheezing or shortness of breath.    [provider]  aspirin 81 MG tablet Take  81 mg by mouth daily.    [provider]  Chromium Picolinate 800 MCG TABS Take by mouth.    [provider]  diphenhydrAMINE (BENADRYL) 25 MG tablet Take 25 mg by mouth daily.    [provider]  eszopiclone (LUNESTA) 2 MG TABS Take 2 mg by mouth at bedtime. Take immediately before bedtime     [provider]  levothyroxine (SYNTHROID, LEVOTHROID) 25 MCG tablet Take 25 mcg by mouth daily before breakfast.  03/07/13   [provider]  loratadine (CLARITIN) 10 MG tablet Take 10 mg by mouth daily.    [provider]  metFORMIN (GLUCOPHAGE-XR) 500 MG 24 hr tablet Take 1,000 mg by mouth 2 (two) times daily. 12/18/10   Shawna Orleans, Doe-Hyun R, DO  montelukast (SINGULAIR) 10 MG tablet Take 10 mg by mouth daily as needed.      [provider]  Omeprazole Magnesium (PRILOSEC OTC PO) Take by mouth as needed.    [provider]  rosuvastatin (CRESTOR) 10 MG tablet Take 1 tablet (10 mg total) by mouth daily. Patient taking differently: Take 10 mg by mouth daily. Takes half a tablet 12/18/10   Yoo, Doe-Hyun R, DO  solifenacin (VESICARE) 5 MG tablet Take 5 mg by mouth daily.    [provider]  triamterene-hydrochlorothiazide (MAXZIDE-25) 37.5-25 MG per tablet Take 1 tablet by mouth daily.    [provider]    Family History Family History  Problem Relation Age of Onset  . Alcohol abuse Unknown   . Autism Daughter        and vitamin K deficiency  . Coronary artery disease Father        MI in his 81s  . Diabetes Unknown        multiple family members  . Colon cancer Neg Hx     Social History Social History   Tobacco Use  . Smoking status: Never Smoker  . Smokeless tobacco: Never Used  Substance Use Topics  . Alcohol use: Yes    Alcohol/week: 0.0 standard drinks    Comment: Rarely  . Drug use: No     Allergies   Lexapro [escitalopram] and Morphine   Review of Systems Review of Systems  Constitutional:  Negative for chills and diaphoresis.  Respiratory: Negative for cough.   Cardiovascular: Positive for syncope. Negative for chest pain, palpitations and leg swelling.  Gastrointestinal: Positive for diarrhea, nausea and vomiting.  Genitourinary: Negative for flank pain.  Neurological: Positive for light-headedness. Negative for focal weakness.  All other systems reviewed and are negative.    Physical Exam Updated Vital Signs BP 113/62 (BP Location: Right Arm)   Pulse 93   Temp 98.6 F (37 C) (Oral)   Resp 18   SpO2 99%   Physical Exam  Constitutional: She is oriented to person, place, and time. She appears well-developed and well-nourished. No distress.  HENT:  Head: Normocephalic and atraumatic.  Mouth/Throat: Oropharynx is clear and moist. No oropharyngeal exudate.  Eyes: Pupils are equal, round, and reactive to light. Conjunctivae are normal.  Neck: Normal range of motion.  Cardiovascular: Regular rhythm, normal heart sounds and intact distal pulses. Tachycardia present.  Pulmonary/Chest: Effort normal. No stridor. She has no wheezes. She has no rales.  Abdominal: Soft. She exhibits distension. She exhibits no mass. There is no tenderness. There is no rebound and no guarding. No hernia.  Gassy   Musculoskeletal: Normal range of motion.  Neurological: She is alert and oriented to person, place, and time.  Skin: Skin is warm and dry. Capillary refill takes less than 2 seconds.  Psychiatric: She has a normal mood and affect.     ED Treatments / Results  Labs (all labs ordered are listed, but only abnormal results are displayed) Results for orders placed or performed during the hospital encounter of 05/08/18  Lipase, blood  Result Value Ref Range   Lipase 32 11 - 51 U/L  Comprehensive metabolic panel  Result Value Ref Range   Sodium 130 (L) 135 - 145 mmol/L   Potassium 3.2 (L) 3.5 - 5.1 mmol/L   Chloride 96 (L) 98 - 111 mmol/L   CO2 23 22 - 32 mmol/L   Glucose, Bld  166 (H) 70 - 99 mg/dL   BUN 24 (H) 6 - 20 mg/dL   Creatinine, Ser 1.61 (H) 0.44 - 1.00 mg/dL   Calcium 7.8 (L) 8.9 - 10.3 mg/dL   Total Protein 6.6 6.5 - 8.1 g/dL   Albumin 3.4 (L) 3.5 - 5.0 g/dL   AST 14 (L) 15 - 41 U/L   ALT 26 0 - 44 U/L   Alkaline Phosphatase 58 38 - 126 U/L   Total Bilirubin 0.7 0.3 - 1.2 mg/dL   GFR calc non Af Amer 35 (L) >60 mL/min   GFR calc Af Amer 41 (L) >60 mL/min   Anion gap 11 5 - 15  CBC  Result Value Ref Range   WBC 9.9 4.0 - 10.5 K/uL   RBC 3.93 3.87 - 5.11 MIL/uL   Hemoglobin 11.8 (L) 12.0 - 15.0 g/dL   HCT 36.5 36.0 - 46.0 %   MCV 92.9 78.0 - 100.0 fL   MCH 30.0 26.0 - 34.0 pg   MCHC 32.3 30.0 - 36.0 g/dL   RDW 12.4 11.5 - 15.5 %   Platelets 359 150 - 400 K/uL  Urinalysis, Routine w reflex microscopic  Result Value Ref Range   Color, Urine YELLOW YELLOW   APPearance HAZY (A) CLEAR   Specific Gravity, Urine 1.019 1.005 - 1.030   pH 5.0 5.0 - 8.0   Glucose, UA NEGATIVE NEGATIVE mg/dL   Hgb urine dipstick SMALL (A) NEGATIVE   Bilirubin Urine NEGATIVE NEGATIVE   Ketones, ur NEGATIVE NEGATIVE mg/dL   Protein, ur NEGATIVE NEGATIVE mg/dL   Nitrite NEGATIVE NEGATIVE   Leukocytes, UA TRACE (A) NEGATIVE   RBC / HPF 0-5 0 - 5 RBC/hpf   WBC, UA 6-10 0 - 5 WBC/hpf   Bacteria, UA RARE (A) NONE SEEN   Squamous Epithelial / LPF 6-10 0 - 5   Hyaline Casts, UA PRESENT   I-Stat beta hCG blood, ED  Result Value Ref Range   I-stat hCG, quantitative 5.5 (H) <5 mIU/mL   Comment 3           No results found.  EKG EKG Interpretation  Date/Time:  Thursday May 08 2018 00:45:00 EDT Ventricular Rate:  108 PR Interval:  154 QRS Duration: 84 QT Interval:  348 QTC Calculation: 466 R Axis:   59 Text Interpretation:  Sinus tachycardia Confirmed by Dory Horn) on 05/08/2018 3:49:03 AM   Radiology No results found.  Procedures Procedures (including critical care time)  Medications Ordered in ED Medications  sodium chloride 0.9 % bolus  1,000 mL (has no administration in time range)  ondansetron (ZOFRAN) injection 4 mg (has no administration in time range)  dicyclomine (BENTYL) injection 20 mg (has no administration in time range)  ondansetron (ZOFRAN-ODT) disintegrating tablet 4 mg (4 mg Oral Given 05/08/18 0040)       Final Clinical Impressions(s) / ED Diagnoses   Will admit for syncope secondary to intractable n/v/d may be secondary to trulicity.      Stacye Noori, MD 05/08/18 7290

## 2018-05-09 DIAGNOSIS — R112 Nausea with vomiting, unspecified: Secondary | ICD-10-CM

## 2018-05-09 DIAGNOSIS — E039 Hypothyroidism, unspecified: Secondary | ICD-10-CM

## 2018-05-09 DIAGNOSIS — E119 Type 2 diabetes mellitus without complications: Secondary | ICD-10-CM

## 2018-05-09 DIAGNOSIS — N179 Acute kidney failure, unspecified: Secondary | ICD-10-CM

## 2018-05-09 DIAGNOSIS — E860 Dehydration: Secondary | ICD-10-CM

## 2018-05-09 DIAGNOSIS — R197 Diarrhea, unspecified: Secondary | ICD-10-CM

## 2018-05-09 LAB — CBC
HEMATOCRIT: 30.8 % — AB (ref 36.0–46.0)
HEMOGLOBIN: 10.2 g/dL — AB (ref 12.0–15.0)
MCH: 30.4 pg (ref 26.0–34.0)
MCHC: 33.1 g/dL (ref 30.0–36.0)
MCV: 91.9 fL (ref 78.0–100.0)
PLATELETS: 289 10*3/uL (ref 150–400)
RBC: 3.35 MIL/uL — AB (ref 3.87–5.11)
RDW: 13 % (ref 11.5–15.5)
WBC: 8.5 10*3/uL (ref 4.0–10.5)

## 2018-05-09 LAB — BASIC METABOLIC PANEL
ANION GAP: 8 (ref 5–15)
BUN: 14 mg/dL (ref 6–20)
CHLORIDE: 111 mmol/L (ref 98–111)
CO2: 21 mmol/L — AB (ref 22–32)
CREATININE: 1.12 mg/dL — AB (ref 0.44–1.00)
Calcium: 7.6 mg/dL — ABNORMAL LOW (ref 8.9–10.3)
GFR calc non Af Amer: 55 mL/min — ABNORMAL LOW (ref >60)
Glucose, Bld: 74 mg/dL (ref 70–99)
Potassium: 3.7 mmol/L (ref 3.5–5.1)
SODIUM: 140 mmol/L (ref 135–145)

## 2018-05-09 LAB — HIV ANTIBODY (ROUTINE TESTING W REFLEX): HIV Screen 4th Generation wRfx: NONREACTIVE

## 2018-05-09 LAB — GLUCOSE, CAPILLARY
Glucose-Capillary: 108 mg/dL — ABNORMAL HIGH (ref 70–99)
Glucose-Capillary: 72 mg/dL (ref 70–99)
Glucose-Capillary: 73 mg/dL (ref 70–99)
Glucose-Capillary: 86 mg/dL (ref 70–99)

## 2018-05-09 MED ORDER — DEXTROSE-NACL 5-0.9 % IV SOLN
INTRAVENOUS | Status: DC
  Administered 2018-05-09: 06:00:00 via INTRAVENOUS

## 2018-05-09 MED ORDER — SODIUM CHLORIDE 0.9 % IV BOLUS
500.0000 mL | Freq: Once | INTRAVENOUS | Status: AC
  Administered 2018-05-09: 500 mL via INTRAVENOUS

## 2018-05-09 MED ORDER — SODIUM CHLORIDE 0.9 % IV SOLN
INTRAVENOUS | Status: DC
  Administered 2018-05-09: 17:00:00 via INTRAVENOUS
  Administered 2018-05-10: 75 mL/h via INTRAVENOUS

## 2018-05-09 NOTE — Discharge Summary (Addendum)
Physician Discharge Summary  [name] GHW:[id number] DOB: 09-02-1961 DOA: 05/08/2018  PCP: Leeroy Cha, MD  Admit date: 05/08/2018 Discharge date: 05/09/2018  Admitted From: Home  Disposition:  Home   Recommendations for Outpatient Follow-up and new medication changes:  1. Follow up with Dr. Fara Olden in 7 days 2. Trulicity has been discontinued due to GI symptoms 3. Resume Onglyza when capillary glucose persistent above 160 mg/dl  Home Health: no  Equipment/Devices: no   Discharge Condition: stable  CODE STATUS: full  Diet recommendation: Diabetic diet   Brief/Interim Summary: 57 year old female who presented with vomiting.  She does have significant past medical history for type 2 diabetes mellitus, HTN, and hypothyroidism who presents with 6 days history of nausea, vomiting and diarrhea.  Symptoms occurred within 24 houres after taking Trulicity.  On her initial physical examination, blood pressure 127/75, heart rate 92, respiratory rate 14, oxygen saturation 94%.  Moist mucous membranes, lungs clear to auscultation bilaterally, heart S1-S2 present and rhythmic, abdomen soft, distended, generalized tenderness, no lower extremity edema.  Sodium 130, potassium 3.2, chloride 96, bicarb 23, glucose 166, BUN 24, creatinine 1.61, AST 14, ALT 26, lipase 32, white count 9.9, hemoglobin 11.8, hematocrit 36.5, platelets 359.  Urinalysis specific gravity 1.019, white cells 6-10.  Abdominal CT with liquid stool throughout the colon.  No signs of inflammation.  EKG with normal sinus rhythm, normal axis and normal intervals.  Patient was admitted to the hospital with the working diagnosis of acute diarrheal illness related to GLP 1 agonist, complicated by dehydration, hyponatremia, hypokalemia and acute kidney injury.   1.  Diarrheal illness related to GLP-1 agonist (trulicity), complicated by dehydration, hyponatremia, hypokalemia and acute kidney injury.  Patient was admitted to the  medical ward, she received intravenous isotonic fluids with improvement of kidney function and electrolytes.  Electrolytes were corrected.  Sodium 140, potassium 3.7, serum bicarb 21, creatinine 1.12.  Patient's diet was advanced progressively with good toleration.  She was advised to discontinue GLP-1 agonist therapy.  She has been started on Imodium as needed and probiotics.  She received a dose of both this morning her symptoms have significantly improved she ambulated  with the RN.  2.  Type 2 diabetes mellitus with hypoglycemia.  She had hypoglycemia that required IV dextrose infusion, her fasting discharge glucose is 74 mg/dl, her diet was advanced with good toleration.  Will resume metformin at discharge, 1000 milligrams twice daily and instruction to resume Onglyza (saxagliptin; dipeptidyl peptidase IV inhibiotr) 5 mg daily, only once glucose greater than 160 mg/dl  3.  Hypertension.  Her hypertensive agents were held during her hospitalization due to dehydration and risk of hypotension. At discharge patient will resume olmesartan, triamterene/hydrochlorothiazide.  4.  Hypothyroidism.  Patient will continue levothyroxine.  5.  Dyslipidemia.  Continue rosuvastatin.    Discharge Diagnoses:  Active Problems:   SYNCOPE   Hypothyroidism   AKI (acute kidney injury) ([place])   DM type 2 (diabetes mellitus, type 2) ([place])   Hepatic steatosis    Discharge Instructions   Allergies as of 05/09/2018      Reactions   Lexapro [escitalopram] Other (See Comments)   Causes grand mal seizures; all antidepressants   Morphine    Morphine Sulfate Rash      Medication List    STOP taking these medications   TRULICITY 7.89 FY/1.0FB Sopn Generic drug:  Dulaglutide     TAKE these medications   albuterol 108 (90 Base) MCG/ACT inhaler Commonly known as:  PROVENTIL  HFA;VENTOLIN HFA Inhale 1 puff into the lungs every 6 (six) hours as needed for wheezing or shortness of breath.   aspirin 81 MG  tablet Take 81 mg by mouth daily.   atorvastatin 20 MG tablet Commonly known as:  LIPITOR Take 20 mg by mouth daily.   FLOVENT HFA 220 MCG/ACT inhaler Generic drug:  fluticasone Inhale 1 puff into the lungs 2 (two) times daily.   levothyroxine 50 MCG tablet Commonly known as:  SYNTHROID, LEVOTHROID Take 50 mcg by mouth daily.   loratadine 10 MG tablet Commonly known as:  CLARITIN Take 10 mg by mouth daily.   metFORMIN 1000 MG tablet Commonly known as:  GLUCOPHAGE Take 1,000 mg by mouth 2 (two) times daily.   montelukast 10 MG tablet Commonly known as:  SINGULAIR Take 10 mg by mouth at bedtime.   olmesartan 5 MG tablet Commonly known as:  BENICAR Take 5 mg by mouth daily.   omeprazole 40 MG capsule Commonly known as:  PRILOSEC Take 40 mg by mouth daily.   ONGLYZA 5 MG Tabs tablet Generic drug:  saxagliptin HCl Take 5 mg by mouth daily.   oxybutynin 5 MG tablet Commonly known as:  DITROPAN Take 5 mg by mouth daily.   rosuvastatin 10 MG tablet Commonly known as:  CRESTOR Take 1 tablet (10 mg total) by mouth daily.   traZODone 50 MG tablet Commonly known as:  DESYREL Take 100 mg by mouth at bedtime.   triamterene-hydrochlorothiazide 37.5-25 MG tablet Commonly known as:  MAXZIDE-25 Take 1 tablet by mouth daily.   venlafaxine XR 75 MG 24 hr capsule Commonly known as:  EFFEXOR-XR Take 75 mg by mouth daily.       Allergies  Allergen Reactions  . Lexapro [Escitalopram] Other (See Comments)    Causes grand mal seizures; all antidepressants  . Morphine   . Morphine Sulfate Rash    Consultations:     Procedures/Studies: Ct Renal Stone Study  Result Date: 05/08/2018 CLINICAL DATA:  Pain.  Nausea, vomiting, diarrhea. EXAM: CT ABDOMEN AND PELVIS WITHOUT CONTRAST TECHNIQUE: Multidetector CT imaging of the abdomen and pelvis was performed following the standard protocol without IV contrast. COMPARISON:  None. FINDINGS: Lower chest: Subsegmental right  basilar atelectasis. Hepatobiliary: The liver is enlarged spanning 22 cm cranial caudal with decreased density consistent with steatosis. No discrete lesion on noncontrast exam. Clips in the gallbladder fossa postcholecystectomy. No biliary dilatation. Pancreas: No ductal dilatation or inflammation. Spleen: Normal in size without focal abnormality. Adrenals/Urinary Tract: Normal adrenal glands. No hydronephrosis. Mild left perinephric edema. No urolithiasis. Urinary bladder is partially distended, no bladder wall thickening or bladder stone. Stomach/Bowel: Liquid stool throughout the colon without colonic wall thickening or inflammatory change. Two ovoid hyperdensities at the base of the cecum, series 3, image 57, approach the base of the appendix and may be appendicoliths, enterolith, or ingested pills. The appendix is normal without periappendiceal inflammation. Small bowel appears diffusely fluid-filled and prominent without obstruction or bowel wall thickening. No perienteric inflammatory change. Stomach is partially distended. Vascular/Lymphatic: Prominent central mesenteric nodes are likely reactive. Abdominal aorta is normal in caliber. Reproductive: Status post hysterectomy. No adnexal masses. Other: No free air, free fluid, or intra-abdominal fluid collection. Tiny fat containing umbilical hernia. Musculoskeletal: Facet arthropathy in the lumbar spine. There are no acute or suspicious osseous abnormalities. IMPRESSION: 1. Liquid stool throughout the colon, as well as fluid-filled small bowel consistent with diarrheal process and possible enteritis. No bowel wall thickening or perienteric inflammation. No  obstruction. 2. Mild left perinephric edema without urolithiasis, can be seen with urinary tract infection or recently passed stone. 3. Hepatomegaly and hepatic steatosis. 4. Hyperdensities at the base of the cecum may be ingested pills, anterolisthesis, or appendicoliths. Appendix is otherwise normal.  Electronically Signed   By: Jeb Levering M.D.   On: 05/08/2018 05:26       Subjective: Patient is feeling better and close to her baseline, increased appetite, no further nausea or vomiting, no diarrhea.   Discharge Exam: Vitals:   05/08/18 2141 05/09/18 0528  BP: (!) 141/69 133/61  Pulse: 91 (!) 102  Resp: 16 18  Temp: 98.2 F (36.8 C) 98.5 F (36.9 C)  SpO2: 100% 97%   Vitals:   05/08/18 1152 05/08/18 1355 05/08/18 2141 05/09/18 0528  BP: 120/66 (!) 116/46 (!) 141/69 133/61  Pulse: 81 88 91 (!) 102  Resp: 16 18 16 18   Temp: 98.9 F (37.2 C) 98.4 F (36.9 C) 98.2 F (36.8 C) 98.5 F (36.9 C)  TempSrc: Oral Oral Oral Oral  SpO2: 99% 100% 100% 97%  Weight:      Height:        General: Not in pain or dyspnea Neurology: Awake and alert, non focal  E ENT: no pallor, no icterus, oral mucosa moist Cardiovascular: No JVD. S1-S2 present, rhythmic, no gallops, rubs, or murmurs. No lower extremity edema. Pulmonary: vesicular breath sounds bilaterally, adequate air movement, no wheezing, rhonchi or rales. Gastrointestinal. Abdomen protuberant with no organomegaly, non tender, no rebound or guarding Skin. No rashes Musculoskeletal: no joint deformities   The results of significant diagnostics from this hospitalization (including imaging, microbiology, ancillary and laboratory) are listed below for reference.     Microbiology: No results found for this or any previous visit (from the past 240 hour(s)).   Labs: BNP (last 3 results) No results for input(s): BNP in the last 8760 hours. Basic Metabolic Panel: Recent Labs  Lab 05/08/18 0037 05/09/18 0409  NA 130* 140  K 3.2* 3.7  CL 96* 111  CO2 23 21*  GLUCOSE 166* 74  BUN 24* 14  CREATININE 1.61* 1.12*  CALCIUM 7.8* 7.6*  MG 1.4*  --   PHOS 2.1*  --    Liver Function Tests: Recent Labs  Lab 05/08/18 0037  AST 14*  ALT 26  ALKPHOS 58  BILITOT 0.7  PROT 6.6  ALBUMIN 3.4*   Recent Labs  Lab  05/08/18 0037  LIPASE 32   No results for input(s): AMMONIA in the last 168 hours. CBC: Recent Labs  Lab 05/08/18 0037 05/09/18 0409  WBC 9.9 8.5  HGB 11.8* 10.2*  HCT 36.5 30.8*  MCV 92.9 91.9  PLT 359 289   Cardiac Enzymes: No results for input(s): CKTOTAL, CKMB, CKMBINDEX, TROPONINI in the last 168 hours. BNP: Invalid input(s): POCBNP CBG: Recent Labs  Lab 05/08/18 1241 05/08/18 1653 05/08/18 2341 05/09/18 0528  GLUCAP 85 84 75 73   D-Dimer No results for input(s): DDIMER in the last 72 hours. Hgb A1c No results for input(s): HGBA1C in the last 72 hours. Lipid Profile No results for input(s): CHOL, HDL, LDLCALC, TRIG, CHOLHDL, LDLDIRECT in the last 72 hours. Thyroid function studies No results for input(s): TSH, T4TOTAL, T3FREE, THYROIDAB in the last 72 hours.  Invalid input(s): FREET3 Anemia work up No results for input(s): VITAMINB12, FOLATE, FERRITIN, TIBC, IRON, RETICCTPCT in the last 72 hours. Urinalysis    Component Value Date/Time   COLORURINE YELLOW 05/08/2018 0114   APPEARANCEUR  HAZY (A) 05/08/2018 0114   LABSPEC 1.019 05/08/2018 0114   PHURINE 5.0 05/08/2018 0114   GLUCOSEU NEGATIVE 05/08/2018 0114   GLUCOSEU NEGATIVE 06/16/2007 0726   HGBUR SMALL (A) 05/08/2018 0114   BILIRUBINUR NEGATIVE 05/08/2018 0114   KETONESUR NEGATIVE 05/08/2018 0114   PROTEINUR NEGATIVE 05/08/2018 0114   UROBILINOGEN 0.2 mg/dL 06/16/2007 0726   NITRITE NEGATIVE 05/08/2018 0114   LEUKOCYTESUR TRACE (A) 05/08/2018 0114   Sepsis Labs Invalid input(s): PROCALCITONIN,  WBC,  LACTICIDVEN Microbiology No results found for this or any previous visit (from the past 240 hour(s)).   Time coordinating discharge: 45 minutes  SIGNED:   Tawni Millers, MD  [facility] 05/09/2018, 10:13 AM Pager [phone]  If 7PM-7AM, please contact night-coverage [email or web address] Password [email or web address]

## 2018-05-10 LAB — GLUCOSE, CAPILLARY
GLUCOSE-CAPILLARY: 114 mg/dL — AB (ref 70–99)
GLUCOSE-CAPILLARY: 117 mg/dL — AB (ref 70–99)

## 2018-05-10 MED ORDER — SACCHAROMYCES BOULARDII 250 MG PO CAPS
250.0000 mg | ORAL_CAPSULE | Freq: Two times a day (BID) | ORAL | Status: DC
  Administered 2018-05-10: 250 mg via ORAL
  Filled 2018-05-10: qty 1

## 2018-05-10 MED ORDER — LOPERAMIDE HCL 2 MG PO CAPS
2.0000 mg | ORAL_CAPSULE | Freq: Three times a day (TID) | ORAL | Status: DC
  Administered 2018-05-10: 2 mg via ORAL
  Filled 2018-05-10: qty 1

## 2018-05-10 MED ORDER — LOPERAMIDE HCL 2 MG PO CAPS: 2.0000 mg | ORAL_CAPSULE | Freq: Four times a day (QID) | ORAL | 0 refills | Status: DC | PRN

## 2018-05-10 MED ORDER — MAGNESIUM SULFATE 2 GM/50ML IV SOLN
2.0000 g | Freq: Once | INTRAVENOUS | Status: AC
  Administered 2018-05-10: 2 g via INTRAVENOUS
  Filled 2018-05-10: qty 50

## 2018-05-10 MED ORDER — SACCHAROMYCES BOULARDII 250 MG PO CAPS: 250.0000 mg | ORAL_CAPSULE | Freq: Two times a day (BID) | ORAL | 0 refills | Status: DC

## 2018-05-10 NOTE — Plan of Care (Signed)
Reviewed d/c instructions, copy given to patient for records. IV removed. Pt ready for discharge.

## 2018-05-10 NOTE — Progress Notes (Signed)
Received Florastor and Imodium by mouth at 0940. Ambulated in hall 720 ft at 1110. Tolerated well and felt well/strong. Had 3 small episodes of passing dizziness; lasted 1-2 seconds and was gone. During last portion of second lap pt had one episode of stomach cramping which passed after 20-25 seconds. Returned to room and up in chair. HR 86 and BP 148/75 on return to room. Will continue to monitor.

## 2018-06-16 DIAGNOSIS — E119 Type 2 diabetes mellitus without complications: Secondary | ICD-10-CM | POA: Diagnosis not present

## 2018-06-16 DIAGNOSIS — H524 Presbyopia: Secondary | ICD-10-CM | POA: Diagnosis not present

## 2018-06-16 LAB — HM DIABETES EYE EXAM

## 2018-06-30 ENCOUNTER — Ambulatory Visit: Payer: 59 | Admitting: Family Medicine

## 2018-07-07 ENCOUNTER — Ambulatory Visit (INDEPENDENT_AMBULATORY_CARE_PROVIDER_SITE_OTHER): Payer: 59 | Admitting: Family Medicine

## 2018-07-07 ENCOUNTER — Encounter: Payer: Self-pay | Admitting: Family Medicine

## 2018-07-07 VITALS — BP 130/82 | HR 83 | Ht 63.0 in | Wt 231.0 lb

## 2018-07-07 DIAGNOSIS — E039 Hypothyroidism, unspecified: Secondary | ICD-10-CM

## 2018-07-07 DIAGNOSIS — C4359 Malignant melanoma of other part of trunk: Secondary | ICD-10-CM

## 2018-07-07 DIAGNOSIS — E215 Disorder of parathyroid gland, unspecified: Secondary | ICD-10-CM | POA: Insufficient documentation

## 2018-07-07 DIAGNOSIS — E1159 Type 2 diabetes mellitus with other circulatory complications: Secondary | ICD-10-CM

## 2018-07-07 DIAGNOSIS — I10 Essential (primary) hypertension: Secondary | ICD-10-CM | POA: Insufficient documentation

## 2018-07-07 DIAGNOSIS — N941 Unspecified dyspareunia: Secondary | ICD-10-CM | POA: Insufficient documentation

## 2018-07-07 DIAGNOSIS — Z7689 Persons encountering health services in other specified circumstances: Secondary | ICD-10-CM

## 2018-07-07 DIAGNOSIS — G8929 Other chronic pain: Secondary | ICD-10-CM | POA: Insufficient documentation

## 2018-07-07 DIAGNOSIS — E1121 Type 2 diabetes mellitus with diabetic nephropathy: Secondary | ICD-10-CM

## 2018-07-07 DIAGNOSIS — E041 Nontoxic single thyroid nodule: Secondary | ICD-10-CM

## 2018-07-07 DIAGNOSIS — M791 Myalgia, unspecified site: Secondary | ICD-10-CM | POA: Insufficient documentation

## 2018-07-07 DIAGNOSIS — M792 Neuralgia and neuritis, unspecified: Secondary | ICD-10-CM

## 2018-07-07 DIAGNOSIS — M545 Low back pain, unspecified: Secondary | ICD-10-CM

## 2018-07-07 DIAGNOSIS — Z23 Encounter for immunization: Secondary | ICD-10-CM

## 2018-07-07 DIAGNOSIS — K760 Fatty (change of) liver, not elsewhere classified: Secondary | ICD-10-CM | POA: Insufficient documentation

## 2018-07-07 DIAGNOSIS — R1310 Dysphagia, unspecified: Secondary | ICD-10-CM | POA: Insufficient documentation

## 2018-07-07 DIAGNOSIS — F39 Unspecified mood [affective] disorder: Secondary | ICD-10-CM

## 2018-07-07 DIAGNOSIS — Z90721 Acquired absence of ovaries, unilateral: Secondary | ICD-10-CM

## 2018-07-07 DIAGNOSIS — E063 Autoimmune thyroiditis: Secondary | ICD-10-CM | POA: Insufficient documentation

## 2018-07-07 DIAGNOSIS — G4700 Insomnia, unspecified: Secondary | ICD-10-CM

## 2018-07-07 DIAGNOSIS — E042 Nontoxic multinodular goiter: Secondary | ICD-10-CM | POA: Insufficient documentation

## 2018-07-07 DIAGNOSIS — I152 Hypertension secondary to endocrine disorders: Secondary | ICD-10-CM

## 2018-07-07 DIAGNOSIS — D649 Anemia, unspecified: Secondary | ICD-10-CM

## 2018-07-07 DIAGNOSIS — E785 Hyperlipidemia, unspecified: Secondary | ICD-10-CM | POA: Insufficient documentation

## 2018-07-07 DIAGNOSIS — R102 Pelvic and perineal pain: Secondary | ICD-10-CM

## 2018-07-07 DIAGNOSIS — K219 Gastro-esophageal reflux disease without esophagitis: Secondary | ICD-10-CM | POA: Insufficient documentation

## 2018-07-07 DIAGNOSIS — Z90710 Acquired absence of both cervix and uterus: Secondary | ICD-10-CM | POA: Insufficient documentation

## 2018-07-07 DIAGNOSIS — E1169 Type 2 diabetes mellitus with other specified complication: Secondary | ICD-10-CM

## 2018-07-07 MED ORDER — ESZOPICLONE 2 MG PO TABS: 2.0000 mg | ORAL_TABLET | Freq: Every evening | ORAL | 0 refills | Status: DC | PRN

## 2018-07-07 NOTE — Patient Instructions (Addendum)
-Referred patient to a new endocrinologist for treatment of her diabetes, thyroid and parathyroid issues. -Referred patient to interventional pain specialist Dr. Maryruth Eve at [facility] in [place] -Patient will call for a behavioral health specialist\social worker and find one she likes. -Recommend patient exercise to a goal of 30 to 45 minutes moderate intensity aerobic activity daily.  She will start off much lower though -Recommend one half of her weight in ounces water per day. -Recommend we obtain fasting blood work and follow-up OV in the near future next available.      Behavioral Health/ Counseling Referrals   Anderson Malta, personal counselor in [place], specializing in marriage counseling    Dr. Tomi Bamberger, PHD Dr. Tomi Bamberger, PHD is a counselor in [place], [place].  [address] [place], [place] [address] [place] [phone]   Kristie Cowman, [place]  [phone] [email or web address] ( she does [place] and faith-based coaching and counseling )    [employer] ( faith-based counseling ) Address: [place]. [place], [place] [address] [phone] [address] for appointments [phone] Fax Hours: Monday - Thursday 8:00am-6:00pm Closed for lunch 12-1Thursday only Friday: Closed all day   Steffanie Rainwater: [phone] or Meg Martinique- [phone] -counselors in [place] who are faith based    [facility] Nunzio Cobbs, [place], ACSW, M.ED.  -Nunzio Cobbs is a licensed clinical social worker in practice over 72 years and with Dr. Chucky May for the last 10 years.  -She sees adults, adolescents, children & families and couples. -Services are provided for mood and anxiety disorders, marital issues, family or parent/child problems, parenting, co-dependency, gender issues, trauma, grief, and stages of life issues. She also provides critical incident  stress debriefing.  -Pamala Hurry accepts many employee assistance programs (EAP), [employer], Pharmacist, hospital.  PHONE  [phone]                FAX [phone]   Rodena Goldmann [email or web address] [employer]- gen counseling;  PHD   Wilber Oliphant, MSW [address] [place]   [facility] Apolonio Schneiders, PhD [address] [place] [phone]   [facility]- children [address], [place], [place] [phone]   Lanelle Bal Professional Counselor Counseling and [employer] [place] abuse [facility] Manager [address] [place] [phone] [place] [address] [place] Delmer Islam, PhD **   -adult, adolescent, and child Oneida Arenas, PhD Leitha Bleak, LCSW Jillene Bucks, PhD-child, adolescent and adults   [facility] [address], [place] [phone] Merilyn Baba, MS-child, adolescent and adults Lennart Pall, PhD-adolescent and adults   [facility]grief, terminal illness [place], [place] [address], [employer] [address], [place], [place] [phone]   [facility] [address] [place] [phone]   [facility] [place] [address] [place] [phone]   [facility] [address], [place] [phone]   [facility] [address] [place] [address], [place] [phone]   If you have insomnia or difficulty sleeping,  this information is for you:  - Avoid caffeinated beverages after lunch,  no alcoholic beverages,  no eating within 2-3 hours of lying down,  avoid exposure to blue light before bed,  avoid daytime naps, and  needs to maintain a regular sleep schedule- go to sleep and wake up around the same time every night.   - Resolve concerns or worries before entering bedroom:  Discussed relaxation techniques with patient and to keep a journal to write down fears\ worries.  I suggested seeing a counselor for CBT.   - Recommend patient meditate or do deep breathing exercises to help relax.   Incorporate the use of white noise machines or listen to "sleep meditation music", or recordings of guided meditations for sleep from YouTube which are free, such as  "guided meditation for detachment from over thinking"  by Mayford Knife.

## 2018-07-07 NOTE — Progress Notes (Signed)
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 malaise/fatigue and weight loss.  HENT: Negative for congestion, sore throat and tinnitus.   Eyes: Negative for blurred vision, double vision and photophobia.  Respiratory: Negative for cough and wheezing.   Cardiovascular: Negative for chest pain and palpitations.  Gastrointestinal: Negative for blood in stool, diarrhea, nausea and vomiting.  Genitourinary: Negative for dysuria, frequency and urgency.       Chronic dsyperunia Night time urination  Musculoskeletal: Negative for joint pain and myalgias.  Skin: Negative for itching and rash.  Neurological: Negative for dizziness, focal weakness, weakness and headaches.  Endo/Heme/Allergies: Negative for environmental allergies and polydipsia. Does not bruise/bleed easily.  Psychiatric/Behavioral: Positive for depression. Negative for memory loss. The patient has insomnia. The patient is not nervous/anxious.        Anxiety chronic     Objective:   Blood pressure 130/82, pulse 83, height 5\' 3"  (1.6 m), weight 231 lb (104.8 kg), SpO2 99 %. Body mass index is 40.92 kg/m. General: Well Developed, well nourished,  and in no acute distress.  Neuro: Alert and oriented x3, extra-ocular muscles intact, sensation grossly intact.  HEENT:[place]/AT, PERRLA, neck supple, No carotid bruits Skin: no gross rashes  Cardiac: Regular rate and rhythm Respiratory: Essentially clear to auscultation bilaterally. Not using accessory muscles, speaking in full sentences.  Abdominal: not grossly distended Vasc: less 2 sec cap RF, warm and pink  Psych:  No HI/SI, judgement and insight good, Euthymic mood. Full Affect. Musculoskeletal: Ambulates w/o diff, FROM * 4 ext.  Back: Patient without any palpable muscle spasms or paravertebral abnormalities.  Patient is tender to light touch over spinous processes L1 through S 2, and in the paravertebral muscle region bilaterally.  She is extremely tender to light touch in the piriformis regions bilaterally left greater than right.  Also very tender to light touch over greater trochanter on the left versus right.  She has multiple muscle and soft tissue trigger points to light touch throughout her skin and muscles in this region

## 2018-07-08 ENCOUNTER — Other Ambulatory Visit (INDEPENDENT_AMBULATORY_CARE_PROVIDER_SITE_OTHER): Payer: 59

## 2018-07-08 DIAGNOSIS — E039 Hypothyroidism, unspecified: Secondary | ICD-10-CM

## 2018-07-08 DIAGNOSIS — Z7689 Persons encountering health services in other specified circumstances: Secondary | ICD-10-CM | POA: Diagnosis not present

## 2018-07-08 DIAGNOSIS — E785 Hyperlipidemia, unspecified: Secondary | ICD-10-CM

## 2018-07-08 DIAGNOSIS — I152 Hypertension secondary to endocrine disorders: Secondary | ICD-10-CM

## 2018-07-08 DIAGNOSIS — E1169 Type 2 diabetes mellitus with other specified complication: Secondary | ICD-10-CM

## 2018-07-08 DIAGNOSIS — I10 Essential (primary) hypertension: Secondary | ICD-10-CM | POA: Diagnosis not present

## 2018-07-08 DIAGNOSIS — E041 Nontoxic single thyroid nodule: Secondary | ICD-10-CM

## 2018-07-08 DIAGNOSIS — F39 Unspecified mood [affective] disorder: Secondary | ICD-10-CM

## 2018-07-08 DIAGNOSIS — M791 Myalgia, unspecified site: Secondary | ICD-10-CM

## 2018-07-08 DIAGNOSIS — E1159 Type 2 diabetes mellitus with other circulatory complications: Secondary | ICD-10-CM | POA: Diagnosis not present

## 2018-07-08 DIAGNOSIS — E215 Disorder of parathyroid gland, unspecified: Secondary | ICD-10-CM | POA: Diagnosis not present

## 2018-07-08 DIAGNOSIS — E1121 Type 2 diabetes mellitus with diabetic nephropathy: Secondary | ICD-10-CM

## 2018-07-08 DIAGNOSIS — E063 Autoimmune thyroiditis: Secondary | ICD-10-CM | POA: Diagnosis not present

## 2018-07-08 DIAGNOSIS — D649 Anemia, unspecified: Secondary | ICD-10-CM

## 2018-07-09 LAB — CBC WITH DIFFERENTIAL/PLATELET
BASOS ABS: 0.1 10*3/uL (ref 0.0–0.2)
Basos: 1 %
EOS (ABSOLUTE): 0.7 10*3/uL — ABNORMAL HIGH (ref 0.0–0.4)
Eos: 7 %
Hematocrit: 34.5 % (ref 34.0–46.6)
Hemoglobin: 11.8 g/dL (ref 11.1–15.9)
Immature Grans (Abs): 0 10*3/uL (ref 0.0–0.1)
Immature Granulocytes: 0 %
LYMPHS: 22 %
Lymphocytes Absolute: 2.3 10*3/uL (ref 0.7–3.1)
MCH: 30 pg (ref 26.6–33.0)
MCHC: 34.2 g/dL (ref 31.5–35.7)
MCV: 88 fL (ref 79–97)
Monocytes Absolute: 0.6 10*3/uL (ref 0.1–0.9)
Monocytes: 5 %
Neutrophils Absolute: 6.7 10*3/uL (ref 1.4–7.0)
Neutrophils: 65 %
PLATELETS: 381 10*3/uL (ref 150–450)
RBC: 3.93 x10E6/uL (ref 3.77–5.28)
RDW: 12.1 % — ABNORMAL LOW (ref 12.3–15.4)
WBC: 10.4 10*3/uL (ref 3.4–10.8)

## 2018-07-09 LAB — HEMOGLOBIN A1C
ESTIMATED AVERAGE GLUCOSE: 148 mg/dL
Hgb A1c MFr Bld: 6.8 % — ABNORMAL HIGH (ref 4.8–5.6)

## 2018-07-09 LAB — COMPREHENSIVE METABOLIC PANEL
ALK PHOS: 67 IU/L (ref 39–117)
ALT: 38 IU/L — AB (ref 0–32)
AST: 20 IU/L (ref 0–40)
Albumin/Globulin Ratio: 1.5 (ref 1.2–2.2)
Albumin: 4.3 g/dL (ref 3.5–5.5)
BILIRUBIN TOTAL: 0.2 mg/dL (ref 0.0–1.2)
BUN/Creatinine Ratio: 18 (ref 9–23)
BUN: 21 mg/dL (ref 6–24)
CHLORIDE: 97 mmol/L (ref 96–106)
CO2: 25 mmol/L (ref 20–29)
CREATININE: 1.16 mg/dL — AB (ref 0.57–1.00)
Calcium: 9.6 mg/dL (ref 8.7–10.2)
GFR calc Af Amer: 62 mL/min/{1.73_m2} (ref >59)
GFR calc non Af Amer: 54 mL/min/{1.73_m2} — ABNORMAL LOW (ref >59)
GLUCOSE: 150 mg/dL — AB (ref 65–99)
Globulin, Total: 2.8 g/dL (ref 1.5–4.5)
Potassium: 4.2 mmol/L (ref 3.5–5.2)
Sodium: 140 mmol/L (ref 134–144)
TOTAL PROTEIN: 7.1 g/dL (ref 6.0–8.5)

## 2018-07-09 LAB — PHOSPHORUS: PHOSPHORUS: 4.1 mg/dL (ref 2.5–4.5)

## 2018-07-09 LAB — MAGNESIUM: Magnesium: 1.7 mg/dL (ref 1.6–2.3)

## 2018-07-09 LAB — LIPID PANEL
CHOL/HDL RATIO: 3.2 ratio (ref 0.0–4.4)
CHOLESTEROL TOTAL: 149 mg/dL (ref 100–199)
HDL: 46 mg/dL (ref >39)
LDL CALC: 71 mg/dL (ref 0–99)
Triglycerides: 160 mg/dL — ABNORMAL HIGH (ref 0–149)
VLDL Cholesterol Cal: 32 mg/dL (ref 5–40)

## 2018-07-09 LAB — VITAMIN D 25 HYDROXY (VIT D DEFICIENCY, FRACTURES): VIT D 25 HYDROXY: 39.5 ng/mL (ref 30.0–100.0)

## 2018-07-09 LAB — TSH: TSH: 2.41 u[IU]/mL (ref 0.450–4.500)

## 2018-07-09 LAB — T4, FREE: Free T4: 1.36 ng/dL (ref 0.82–1.77)

## 2018-07-10 ENCOUNTER — Ambulatory Visit: Payer: 59 | Admitting: Family Medicine

## 2018-07-10 ENCOUNTER — Encounter: Payer: Self-pay | Admitting: Family Medicine

## 2018-07-10 VITALS — BP 140/88 | HR 85 | Ht 63.0 in | Wt 230.0 lb

## 2018-07-10 DIAGNOSIS — E785 Hyperlipidemia, unspecified: Secondary | ICD-10-CM

## 2018-07-10 DIAGNOSIS — E1121 Type 2 diabetes mellitus with diabetic nephropathy: Secondary | ICD-10-CM | POA: Diagnosis not present

## 2018-07-10 DIAGNOSIS — E1122 Type 2 diabetes mellitus with diabetic chronic kidney disease: Secondary | ICD-10-CM

## 2018-07-10 DIAGNOSIS — I10 Essential (primary) hypertension: Secondary | ICD-10-CM

## 2018-07-10 DIAGNOSIS — I152 Hypertension secondary to endocrine disorders: Secondary | ICD-10-CM

## 2018-07-10 DIAGNOSIS — G4700 Insomnia, unspecified: Secondary | ICD-10-CM

## 2018-07-10 DIAGNOSIS — E559 Vitamin D deficiency, unspecified: Secondary | ICD-10-CM | POA: Insufficient documentation

## 2018-07-10 DIAGNOSIS — D649 Anemia, unspecified: Secondary | ICD-10-CM

## 2018-07-10 DIAGNOSIS — E1159 Type 2 diabetes mellitus with other circulatory complications: Secondary | ICD-10-CM

## 2018-07-10 DIAGNOSIS — E1169 Type 2 diabetes mellitus with other specified complication: Secondary | ICD-10-CM

## 2018-07-10 DIAGNOSIS — K760 Fatty (change of) liver, not elsewhere classified: Secondary | ICD-10-CM

## 2018-07-10 DIAGNOSIS — E039 Hypothyroidism, unspecified: Secondary | ICD-10-CM

## 2018-07-10 DIAGNOSIS — N183 Chronic kidney disease, stage 3 unspecified: Secondary | ICD-10-CM

## 2018-07-10 MED ORDER — OLMESARTAN MEDOXOMIL 5 MG PO TABS: 5.0000 mg | ORAL_TABLET | Freq: Every day | ORAL | 1 refills | Status: DC

## 2018-07-10 MED ORDER — VITAMIN D 50 MCG (2000 UT) PO CAPS: ORAL_CAPSULE | ORAL | Status: AC

## 2018-07-10 NOTE — Patient Instructions (Addendum)
-Melissa please add a B12 level to patient's recent lab work.  Add back your olmesartan at 5 mg daily.  Please half the Maxide when you first started to make sure your blood pressure does not go too low.  If your blood pressures are not at goal of consistently less than 130/80, then you can add both of them at full dose.  -Prior to your appointment in 3 to 4 months-please come in to get a CMP, A1c,vit D and urine microalbumin level- then OV with me 3 to 4 days later   The quick and dirty--> lower triglyceride levels more through...  1) - Beware of bad fats: Cutting back on saturated fat (in red meat and full-fat dairy foods) and trans fats (in restaurant fried foods and commercially prepared baked goods) can lower triglycerides.  2) - Go for good carbs: Easily digested carbohydrates (such as white bread, white rice, cornflakes, and sugary sodas) give triglycerides a definite boost.   3) - Eating whole grains and cutting back on soda can help control triglycerides.  4) - Check your alcohol use. In some people, alcohol dramatically boosts triglycerides. The only way to know if this is true for you is to avoid alcohol for a few weeks and have your triglycerides tested again.  5) - Go fish. Omega-3 fats in salmon, tuna, sardines, and other fatty fish can lower triglycerides. Having fish twice a week is fine.  6) - Aim for a healthy weight. If you are overweight, losing just 5% to 10% of your weight can help drive down triglycerides.  7) - Get moving. Exercise lowers triglycerides and boosts heart-healthy HDL cholesterol.  8) - quit smoking if you do  --> for more information, see below; or go to  [email or web address]  and do a search for desired topics   For those diagnosed with high triglycerides, its important to take action to lower your levels and improve your heart health.  Triglyceride is just a fancy word for fat -- the fat in our bodies is stored in the form of triglycerides.  Triglycerides are found in foods and manufactured in our bodies.  Normal triglyceride levels are defined as less than 150 mg/dL; 150 to 199 is considered borderline high; 200 to 499 is high; and 500 or higher is officially called very high. To me, anything over 150 is a red flag indicating my patient needs to take immediate steps to get the situation under control.   What is the significance of high triglycerides? High triglyceride levels make blood thicker and stickier, which means that it is more likely to form clots. Studies have shown that triglyceride levels are associated with increased risks of cardiovascular disease and stroke -- in both men and women -- alone or in combination with other risk factors (high triglycerides combined with high LDL cholesterol can be a particularly deadly combination). For example, in one ground-breaking study, high triglycerides alone increased the risk of cardiovascular disease by 14 percent in men, and by 27 percent in women. But when the test subjects also had low HDL cholesterol (thats the good cholesterol) and other risk factors, high triglycerides increased the risk of disease by 32 percent in men and 76 percent in women.   Fortunately, triglycerides can sometimes be controlled with several diet and lifestyle changes.    What Factors Can Increase Triglycerides? As with cholesterol, eating too much of the wrong kinds of fats will raise your blood triglycerides.  Therefore, its important to restrict the amounts  of saturated fats and trans fats you allow into your diet.  Triglyceride levels can also shoot up after eating foods that are high in carbohydrates or after drinking alcohol.  Thats why triglyceride blood tests require an overnight fast.  If you have elevated triglycerides, its especially important to avoid sugary and refined carbohydrates, including sugar, honey, and other sweeteners, soda and other sugary drinks, candy, baked goods, and anything made  with white (refined or enriched) flour, including white bread, rolls, cereals, buns, pastries, regular pasta, and white rice.  Youll also want to limit dried fruit and fruit juice since theyre dense in simple sugar.  All of these low-quality carbs cause a sudden rise in insulin, which may lead to a spike in triglycerides.  Triglycerides can also become elevated as a reaction to having diabetes, hypothyroidism, or kidney disease. As with most other heart-related factors, being overweight and inactive also contribute to abnormal triglycerides. And unfortunately, some people have a genetic predisposition that causes them to manufacture way too much triglycerides on their own, no matter how carefully they eat.     How Can You Lower Your Triglyceride Levels? If you are diagnosed with high triglycerides, its important to take action. There are several things you can do to help lower your triglyceride levels and improve your heart health:  --> Lose weight if you are overweight.  There is a clear correlation between obesity and high triglycerides -- the heavier people are, the higher their triglyceride levels are likely to be. The good news is that losing weight can significantly lower triglycerides. In a large study of individuals with type 2 diabetes, those assigned to the lifestyle intervention group -- which involved counseling, a low-calorie meal plan, and customized exercise program -- lost 8.6% of their body weight and lowered their triglyceride levels by more than 16%. If youre overweight, find a weight loss plan that works for you and commit to shedding the pounds and getting healthier.  --> Reduce the amount of saturated fat and trans fat in your diet.  Start by avoiding or dramatically limiting butter, cream cheese, lard, sour cream, doughnuts, cakes, cookies, candy bars, regular ice cream, fried foods, pizza, cheese sauce, cream-based sauces and salad dressings, high-fat meats (including  fatty hamburgers, bologna, pepperoni, sausage, bacon, salami, pastrami, spareribs, and hot dogs), high-fat cuts of beef and pork, and whole-milk dairy products.   Other ways to cut back: Choose lean meats only (including skinless chicken and [place], lean beef, lean pork), fish, and reduced-fat or fat-free dairy products.   Experiment with adding whole soy foods to your diet. Although soy itself may not reduce risk of heart disease, it replaces hazardous animal fats with healthier proteins. Choose high-quality soy foods, such as tofu, tempeh, soy milk, and edamame (whole soybeans).  Always remove skin from poultry.  Prepare foods by baking, roasting, broiling, boiling, poaching, steaming, grilling, or stir-frying in vegetable oil.  Most stick margarines contain trans fats, and trans fats are also found in some packaged baked goods, potato chips, snack foods, fried foods, and fast food that use or create hydrogenated oils.    (All food labels must now list the amount of trans fats, right after the amount of saturated fats -- good news for consumers. As a result, many food companies have now reformulated their products to be trans fat freemany, but not all! So its still just as important to read labels and make sure the packaged foods you buy dont contain trans fats.)  If you use margarine, purchase soft-tub margarine spreads that contain 0 grams trans fats and dont list any partially hydrogenated oils in the ingredients list. By substituting olive oil or vegetable oil for trans fats in just 2 percent of your daily calories, you can reduce your risk of heart disease by 53 percent.   There is no safe amount of trans fats, so try to keep them as far from your plate as possible.  -->  Avoid foods that are concentrated in sugar (even dried fruit and fruit juice). Sugary foods can elevate triglyceride levels in the blood, so keep them to a bare minimum.  --> Swap out refined carbohydrates for whole  grains.  Refined carbohydrates -- like white rice, regular pasta, and anything made with white or enriched flour (including white bread, rolls, cereals, buns, and crackers) -- raise blood sugar and insulin levels more than fiber-rich whole grains. Higher insulin levels, in turn, can lead to a higher rise in triglycerides after a meal. So, make the switch to whole wheat bread, whole grain pasta, brown or wild rice, and whole grain versions of cereals, crackers, and other bread products. However, its important to know that individuals with high triglycerides should moderate even their intake of high-quality starches (since all starches raise blood sugar) -- I recommend 1 to 2 servings per meal.  --> Cut way back on alcohol.  If you have high triglycerides, alcohol should be considered a rare treat -- if you indulge at all, since even small amounts of alcohol can dramatically increase triglyceride levels.  --> Incorporate omega-3 fats.  Heart-healthy fish oils are especially rich in omega-3 fatty acids. In multiple studies over the past two decades, people who ate diets high in omega-3s had 30 to 40 percent reductions in heart disease. Although we dont yet know why fish oil works so well, there are several possibilities. Omega-3s seem to reduce inflammation, reduce high blood pressure, decrease triglycerides, raise HDL cholesterol, and make blood thinner and less sticky so it is less likely to clot. Its as close to a food prescription for heart health as it gets. If you have high triglycerides, I recommend eating at least three servings of one of the omega-3-rich fish every week (fatty fish is the most concentrated food form of omega three fats). If you cannot manage to eat that much fish, speak with your physician about taking fish oil capsules, which offer similar benefits.The best foods for omega-3 fatty acids include wild salmon (fresh, canned), herring, mackerel (not king), sardines, anchovies, rainbow  trout, and Pacific oysters. Non-fish sources of omega-3 fats include omega-3-fortified eggs, ground flaxseed, chia seeds, walnuts, butternuts (white walnuts), seaweed, walnut oil, canola oil, and soybeans.  --> Quit smoking.  Smoking causes inflammation, not just in your lungs, but throughout your body. Inflammation can contribute to atherosclerosis, blood clots, and risk of heart attack. Smoking makes all heart health indicators worse. If you have high cholesterol, high triglycerides, or high blood pressure, smoking magnifies the danger.  --> Become more physically active.  Even moderate exercise can help improve cholesterol, triglycerides, and blood pressure. Aerobic exercise seems to be able to stop the sharp rise of triglycerides after eating, perhaps because of a decrease in the amount of triglyceride released by the liver, or because active muscle clears triglycerides out of the blood stream more quickly than inactive muscle. If you havent exercised regularly (or at all) for years, I recommend starting slowly, by walking at an easy pace for 15 minutes  a day. Then, as you feel more comfortable, increase the amount. Your ultimate goal should be at least 30 minutes of moderate physical activity, at least five days a week.   Nine ways to increase your "good" HDL cholesterol  High-density lipoprotein (HDL) is often referred to as the "good" cholesterol. Having high HDL levels helps carry cholesterol from your arteries to your liver, where it can be used or excreted.  Having high levels of HDL also has antioxidant and anti-inflammatory effects, and is linked to a reduced risk of heart disease (1, 2).  Most health experts recommend minimum blood levels of 40 mg/dl in men and 50 mg/dl in women.  While genetics definitely play a role, there are several other factors that affect HDL levels.  Here are nine healthy ways to raise your "good" HDL cholesterol.  1. Consume olive oil  two pieces of  salmon on a plate olive oil being poured into a small dish Extra virgin olive oil may be more healthful than processed olive oils. Olive oil is one of the healthiest fats around.  A large analysis of 42 studies with more than 800,000 participants found that olive oil was the only source of monounsaturated fat that seemed to reduce heart disease risk (3).  Research has shown that one of olive oil's heart-healthy effects is an increase in HDL cholesterol. This effect is thought to be caused by antioxidants it contains called polyphenols (4, 5, 6, 7).  Extra virgin olive oil has more polyphenols than more processed olive oils, although the amount can still vary among different types and brands.  One study gave 200 healthy young men about 2 tablespoons (25 ml) of different olive oils per day for three weeks.  The researchers found that participants' HDL levels increased significantly more after they consumed the olive oil with the highest polyphenol content (6).  In another study, when 64 older adults consumed about 4 tablespoons (50 ml) of high-polyphenol extra virgin olive oil every day for six weeks, their HDL cholesterol increased by 6.5 mg/dl, on average (7).  In addition to raising HDL levels, olive oil has been found to boost HDL's anti-inflammatory and antioxidant function in studies of older people and individuals with high cholesterol levels ( 7, 8, 9).  Whenever possible, select high-quality, certified extra virgin olive oils, which tend to be highest in polyphenols.  Bottom line: Extra virgin olive oil with a high polyphenol content has been shown to increase HDL levels in healthy people, the elderly and individuals with high cholesterol.  2. Follow a low-carb or ketogenic diet  Low-carb and ketogenic diets provide a number of health benefits, including weight loss and reduced blood sugar levels.  They have also been shown to increase HDL cholesterol in people who tend to have  lower levels.  This includes those who are obese, insulin resistant or diabetic (10, 11, 12, 13, 14, 15, 16, 17).  In one study, people with type 2 diabetes were split into two groups.  One followed a diet consuming less than 50 grams of carbs per day. The other followed a high-carb diet.  Although both groups lost weight, the low-carb group's HDL cholesterol increased almost twice as much as the high-carb group's did (14).  In another study, obese people who followed a low-carb diet experienced an increase in HDL cholesterol of 5 mg/dl overall.  Meanwhile, in the same study, the participants who ate a low-fat, high-carb diet showed a decrease in HDL cholesterol (15).  This response  may partially be due to the higher levels of fat people typically consume on low-carb diets.  One study in overweight women found that diets high in meat and cheese increased HDL levels by 5-8%, compared to a higher-carb diet (18).  What's more, in addition to raising HDL cholesterol, very-low-carb diets have been shown to decrease triglycerides and improve several other risk factors for heart disease (13, 14, 16, 17).  Bottom line: Low-carb and ketogenic diets typically increase HDL cholesterol levels in people with diabetes, metabolic syndrome and obesity.  3. Exercise regularly  Being physically active is important for heart health.  Studies have shown that many different types of exercise are effective at raising HDL cholesterol, including strength training, high-intensity exercise and aerobic exercise (19, 20, 21, 22, 23, 24).  However, the biggest increases in HDL are typically seen with high-intensity exercise.  One small study followed women who were living with polycystic ovary syndrome (PCOS), which is linked to a higher risk of insulin resistance. The study required them to perform high-intensity exercise three times a week.  The exercise led to an increase in HDL cholesterol of 8 mg/dL after 10  weeks. The women also showed improvements in other health markers, including decreased insulin resistance and improved arterial function (23).  In a 12-week study, overweight men who performed high-intensity exercise experienced a 10% increase in HDL cholesterol.  In contrast, the low-intensity exercise group showed only a 2% increase and the endurance training group experienced no change (24).  However, even lower-intensity exercise seems to increase HDL's anti-inflammatory and antioxidant capacities, whether or not HDL levels change (20, 21, 25).  Overall, high-intensity exercise such as high-intensity interval training (HIIT) and high-intensity circuit training (HICT) may boost HDL cholesterol levels the most.  Bottom line: Exercising several times per week can help raise HDL cholesterol and enhance its anti-inflammatory and antioxidant effects. High-intensity forms of exercise may be especially effective.  4. Add coconut oil to your diet  Studies have shown that coconut oil may reduce appetite, increase metabolic rate and help protect brain health, among other benefits.  Some people may be concerned about coconut oil's effects on heart health due to its high saturated fat content.  However, it appears that coconut oil is actually quite heart healthy.  Coconut oil tends to raise HDL cholesterol more than many other types of fat.  In addition, it may improve the ratio of low-density-lipoprotein (LDL) cholesterol, the "bad" cholesterol, to HDL cholesterol. Improving this ratio reduces heart disease risk (26, 27, 28, 29).  One study examined the health effects of coconut oil on 71 women with excess belly fat. The researchers found that participants who took coconut oil daily experienced increased HDL cholesterol and a lower LDL-to-HDL ratio.  In contrast, the group who took soybean oil daily had a decrease in HDL cholesterol and an increase in the LDL-to-HDL ratio (29).  Most studies have  found these health benefits occur at a dosage of about 2 tablespoons (30 ml) of coconut oil per day. It's best to incorporate this into cooking rather than eating spoonfuls of coconut oil on their own.  Bottom line: Consuming 2 tablespoons (30 ml) of coconut oil per day may help increase HDL cholesterol levels.  5. Stop smoking  cigarette butt Quitting smoking can reduce the risk of heart disease and lung cancer. Smoking increases the risk of many health problems, including heart disease and lung cancer (30).  One of its negative effects is a suppression of HDL cholesterol.  Some studies have found that quitting smoking can increase HDL levels. Indeed, one study found no significant differences in HDL levels between former smokers and people who had never smoked (31, 32, 33, 34, 35).  In a one-year study of more than 1,500 people, those who quit smoking had twice the increase in HDL as those who resumed smoking within the year. The number of large HDL particles also increased, which further reduced heart disease risk (32).  One study followed smokers who switched from traditional cigarettes to electronic cigarettes for one year. They found that the switch was associated with an increase in HDL cholesterol of 5 mg/dl, on average (33).  When it comes to the effect of nicotine replacement patches on HDL levels, research results have been mixed.  One study found that nicotine replacement therapy led to higher HDL cholesterol. However, other research suggests that people who use nicotine patches likely won't see increases in HDL levels until after replacement therapy is completed (34, 36).  Even in studies where HDL cholesterol levels didn't increase after people quit smoking, HDL function improved, resulting in less inflammation and other beneficial effects on heart health (37).  Bottom line: Quitting smoking can increase HDL levels, improve HDL function and help protect heart health.  6. Lose  weight  When overweight and obese people lose weight, their HDL cholesterol levels usually increase.  What's more, this benefit seems to occur whether weight loss is achieved by calorie counting, carb restriction, intermittent fasting, weight loss surgery or a combination of diet and exercise (16, 38, 39, 40, 41, 42).  One study examined HDL levels in more than 3,000 overweight and obese [place] adults who followed a lifestyle modification program for one year.  The researchers found that losing at least 6.6 lbs (3 kg) led to an increase in HDL cholesterol of 4 mg/dl, on average (41).  In another study, when obese people with type 2 diabetes consumed calorie-restricted diets that provided 20-30% of calories from protein, they experienced significant increases in HDL cholesterol levels (42).  The key to achieving and maintaining healthy HDL cholesterol levels is choosing the type of diet that makes it easiest for you to lose weight and keep it off.  Bottom Line: Several methods of weight loss have been shown to increase HDL cholesterol levels in people who are overweight or obese.  7. Choose purple produce  Consuming purple-colored fruits and vegetables is a delicious way to potentially increase HDL cholesterol.  Purple produce contains antioxidants known as anthocyanins.  Studies using anthocyanin extracts have shown that they help fight inflammation, protect your cells from damaging free radicals and may also raise HDL cholesterol levels (43, 44, 45, 46).  In a 24-week study of 53 people with diabetes, those who took an anthocyanin supplement twice a day experienced a 19% increase in HDL cholesterol, on average, along with other improvements in heart health markers (45).  In another study, when people with cholesterol issues took anthocyanin extract for 12 weeks, their HDL cholesterol levels increased by 13.7% (46).  Although these studies used extracts instead of foods, there are  several fruits and vegetables that are very high in anthocyanins. These include eggplant, purple corn, red cabbage, blueberries, blackberries and black raspberries.  Bottom line: Consuming fruits and vegetables rich in anthocyanins may help increase HDL cholesterol levels.  8. Eat fatty fish often  The omega-3 fats in fatty fish provide major benefits to heart health, including a reduction in inflammation and better functioning of the  cells that line your arteries (47, 48).  There's some research showing that eating fatty fish or taking fish oil may also help raise low levels of HDL cholesterol (49, 50, 51, 52, 53).  In a study of 33 heart disease patients, participants that consumed fatty fish four times per week experienced an increase in HDL cholesterol levels. The particle size of their HDL also increased (52).  In another study, overweight men who consumed herring five days a week for six weeks had a 5% increase in HDL cholesterol, compared with their levels after eating lean pork and chicken five days a week (53).  However, there are a few studies that found no increase in HDL cholesterol in response to increased fish or omega-3 supplement intake (54, 55).  In addition to herring, other types of fatty fish that may help raise HDL cholesterol include salmon, sardines, mackerel and anchovies.  Bottom line: Eating fatty fish several times per week may help increase HDL cholesterol levels and provide other benefits to heart health.  9. Avoid artificial trans fats  Artificial trans fats have many negative health effects due to their inflammatory properties (56, 57).  There are two types of trans fats. One kind occurs naturally in animal products, including full-fat dairy.  In contrast, the artificial trans fats found in margarines and processed foods are created by adding hydrogen to unsaturated vegetable and seed oils. These fats are also known as industrial trans fats or partially  hydrogenated fats.  Research has shown that, in addition to increasing inflammation and contributing to several health problems, these artificial trans fats may lower HDL cholesterol levels.  In one study, researchers compared how people's HDL levels responded when they consumed different margarines.  The study found that participants' HDL cholesterol levels were 10% lower after consuming margarine containing partially hydrogenated soybean oil, compared to their levels after consuming palm oil (58).  Another controlled study followed 40 adults who had diets high in different types of trans fats.  They found that HDL cholesterol levels in women were significantly lower after they consumed the diet high in industrial trans fats, compared to the diet containing naturally occurring trans fats (59).  To protect heart health and keep HDL cholesterol in the healthy range, it's best to avoid artificial trans fats altogether.  Bottom line: Artificial trans fats have been shown to lower HDL levels and increase inflammation, compared to other fats.  Take home message  Although your HDL cholesterol levels are partly determined by your genetics, there are many things you can do to naturally increase your own [email or web address]  Fortunately, the practices that raise HDL cholesterol often provide other health benefits as well.

## 2018-07-10 NOTE — Progress Notes (Signed)
Assessment and plan:  1. Type 2 diabetes mellitus with stage 3 chronic kidney disease, without long-term current use of insulin ([place])   2. Type 2 diabetes mellitus with diabetic nephropathy, unspecified whether long term insulin use ([place])   3. Hypertension associated with diabetes ([place])   4. Hyperlipidemia associated with type 2 diabetes mellitus ([place])   5. Hepatic steatosis   6. Anemia, unspecified type   7. Hypothyroidism, unspecified type   8. INSOMNIA, CHRONIC   9. Morbid obesity ([place])   10. Vitamin D insufficiency     1. Reviewed recent lab work (07/08/2018) in depth with patient today.  All lab work within normal limits unless otherwise noted.  - Encouraged patient to continue multivitamin and synthroid as prescribed.  - Magnesium - h/o Deficiency per pt - Advised patient to continue magnesium supplements, and to implement more high-magnesium foods in her diet.  Per patient, notes she had an issue with magnesium in the past.  - Vitamin D Insufficiency - Add a 2000 IU Vitamin D3 supplement. - Reviewed goal as 50-60 range.  - Elevated ALT - Hepatic steatosis  - Recommended that patient lose weight and avoid hepatotoxic substances.  - Elevated Serum Creatinine, GFR of 54 (below normal) - Advised patient to hydrate adequately and avoid nephrotoxic substances. - Reviewed that adequate hydration and exercise is critical for kidney health.  2. Type 2 diabetes mellitus with stage 3 chronic kidney disease, diabetic nephropathy, without long-term current use of insulin  - A1c = 6.8 last lab check.  Per patient, A1c was 7.2 a year ago. - Re-check A1c in 4 months.  - Stable under 7.0 A1c at this time. - Reviewed that fasting sugars less than 130 are ideal.  - No change in treatment plan recommended today.  See med list below. - Patient tolerating meds well without complication.  Denies S-E  - Counseled patient  on pathophysiology of disease and discussed various treatment options, which often includes dietary and lifestyle modifications as first line.  Importance of low carb/ketogenic diet discussed with patient in addition to regular exercise.   - Check FBS, and 2 hours after the biggest meal of your day.  Encouraged patient to check blood sugar after she eats particularly well, or particularly poorly.  Keep log and bring in next OV for my review.   Also, if you ever feel poorly, please check your blood pressure and blood sugar, as one or the other could be the cause of your symptoms.  3. Hypertension - Reduce to 1/2 tab of Maxzide and add Olmesartan-Benicar '5mg'$ .  See med list below. - Discussed preference to maintain on Olmesartan to protect pt kidneys. - Patient tolerating meds well without complication.  Denies S-E.  - Told patient to monitor blood pressure and aim for less than 130/80.  Advised patient to continue to monitor for sx such as dizziness or lightheadedness.  If blood pressure are running 120's/60's without dizziness or lightheadedness, reviewed this is fine.   - Lifestyle changes such as dash diet and engaging in a regular exercise program discussed with patient.  Educational handouts provided  - Ambulatory BP monitoring encouraged. Keep log and bring in next OV.  4. Hyperlipidemia - Controlled on Lipitor at this time. - No change in treatment plan recommended today.  See med list below. - Patient tolerating meds well without complication.  Denies S-E.  Lipid Panel - Drawn 07/08/2018 Triglycerides = 160. HDL = 46. LDL = 71.  -  Reviewed goal lipid panel with patient today.  - Dietary changes such as low saturated & trans fat and low carb/ ketogenic diets discussed with patient.  Encouraged regular exercise and weight loss when appropriate.   - Educational handouts provided at patient's desire.  5. Insomnia - Controlled on Lunesta. - No change in treatment plan recommended  today.  See med list below. - Patient tolerating meds well without complication.  Denies S-E  - Discussed critical importance of prudent sleep hygiene. - Encouraged exercise during the day to improve sleep at night.  6. BMI Counseling Explained to patient what BMI refers to, and what it means medically.  Told patient to think about it as a "medical risk stratification measurement" and how increasing BMI is associated with increasing risk/ or worsening state of various diseases such as hypertension, hyperlipidemia, diabetes, premature OA, depression etc.  American Heart Association guidelines for healthy diet, basically Mediterranean diet, and exercise guidelines of 30 minutes 5 days per week or more discussed in detail.  Health counseling performed.  All questions answered.  7. Lifestyle & Preventative Health Maintenance - Advised patient to continue working toward exercising to improve overall mental, physical, and emotional health.    - Encouraged patient to engage in daily physical activity, especially a formal exercise routine.  Recommended that the patient eventually strive for at least 150 minutes of moderate cardiovascular activity per week according to guidelines established by the [facility].   - Healthy dietary habits encouraged, including low-carb, and high amounts of lean protein in diet.   - Patient should also consume adequate amounts of water - half of body weight in oz of water per day.  8. Follow-Up - Prescriptions refilled today PRN. - Re-check lab work as discussed or recommended. - Otherwise, continue to return for CPE and chronic follow-up as scheduled.   - Patient knows to call in sooner if desired to address acute concerns.   Education and routine counseling performed. Handouts provided.  Pt was in the office today for 32.5+ minutes, with over 50% time spent in face to face counseling of patients various medical conditions, treatment plans of those medical conditions  including medicine management and lifestyle modification, strategies to improve health and well being; and in coordination of care. SEE ABOVE TREATMENT PLAN FOR DETAILS   Orders Placed This Encounter  Procedures  . CBC with Differential/Platelet  . Comprehensive metabolic panel  . Hemoglobin A1c  . Lipid panel  . T4, free  . TSH  . VITAMIN D 25 Hydroxy (Vit-D Deficiency, Fractures)  . B12    Meds ordered this encounter  Medications  . olmesartan (BENICAR) 5 MG tablet    Sig: Take 1 tablet (5 mg total) by mouth daily.    Dispense:  90 tablet    Refill:  1  . Cholecalciferol (VITAMIN D) 2000 units CAPS    Sig: 1 tab p.o. daily    Dispense:  30 capsule    There are no discontinued medications.    Return for 3-4 mo diabetes, blood pressure, etc. with Bldwrk 3 d prior.   Anticipatory guidance and routine counseling done re: condition, txmnt options and need for follow up. All questions of patient's were answered.   Gross side effects, risk and benefits, and alternatives of medications discussed with patient.  Patient is aware that all medications have potential side effects and we are unable to predict every sideeffect or drug-drug interaction that may occur.  Expresses verbal understanding and consents to current therapy  plan and treatment regiment.  Please see AVS handed out to patient at the end of our visit for additional patient instructions/ counseling done pertaining to today's office visit.  Note:  This document was prepared using Dragon voice recognition software and may include unintentional dictation errors.  This document serves as a record of services personally performed by Mellody Dance, DO. It was created on her behalf by [name], a trained medical scribe. The creation of this record is based on the scribe's personal observations and the provider's statements to them.   I have reviewed the above medical documentation for accuracy and completeness and  I concur.  Mellody Dance, D.O.    ----------------------------------------------------------------------------------------------------------------------  Subjective:   CC:   [name] is a 57 y.o. female who presents to [place] at [facility] today for review and discussion of recent bloodwork that was done in addition to f/up on chronic conditions we are managing for pt.  1. All recent blood work that we ordered was reviewed with patient today.  Patient was counseled on all abnormalities and we discussed dietary and lifestyle changes that could help those values (also medications when appropriate).  Extensive health counseling performed and all patient's concerns/ questions were addressed.  See labs below and also plan for more details of these abnormalities  She occasionally takes Advil.  Life Stress She is putting her 44 year old dog down soon.  Notes that she and her husband have had a lot going on trying to keep track of this.  Continues to experience stress related to her disabled adult daughter.  Eating Habits Patient does not eat beef, pork, or poultry, because "everything gets stuck and I have to take six to nine laxatives to get it to pass through my body."  States she has to watch how much cheese she eats.  Patient notes that she overeats when she is on duty taking care of her daughter, especially at night.  She states that she knows she's a "stress eater."  Multivitamin Supplementation States that she started taking a multivitamin two months ago.  She was experiencing hair loss which was alleviated after starting Biotin.  Sleep Is taking Lunesta.  Still has problems falling asleep, but it keeps her asleep.  Denies grogginess.  States that her sleep patterns have improved.  She is sleeping longer, more restfully while taking it.  Hydration Habits Patient started with changing her hydration habits.  She is up to 75 ounces.  States "and I hate water."   States that she avoid sugar drinks, but in the past, if she does drink, she drinks diet lemonade, sometimes rarely a diet pepsi.  Notes "since I switched to water, I sleep through the night."  DM HPI:  -  She has mildly been working on diet and exercise for diabetes  Pt is currently maintained on the following medications for diabetes:  See med list today.  Patient was hospitalized after trying GLP-1's.  Medication compliance - Continues as prescribed.  Patient has not been keeping track of her blood sugar measurements.   Denies polyuria/polydipsia.  Denies hypo/ hyperglycemia symptoms  Last diabetic eye exam was  Lab Results  Component Value Date   HMDIABEYEEXA normal 03/27/2010    Foot exam- UTD  Last A1C in the office was:  Lab Results  Component Value Date   HGBA1C 6.8 (H) 07/08/2018   HGBA1C 7.1 (A) 03/17/2018   HGBA1C 6.6 (A) 11/01/2017    Lab Results  Component Value  Date   MICROALBUR 0.81 03/17/2018   LDLCALC 71 07/08/2018   CREATININE 1.16 (H) 07/08/2018    Wt Readings from Last 3 Encounters:  07/10/18 230 lb (104.3 kg)  07/07/18 231 lb (104.8 kg)  05/08/18 235 lb (106.6 kg)    1. HTN HPI:  -  Her blood pressure has been controlled at home.  Pt is checking it at home.   - Notes that her blood pressure runs in the 130/70 range at home.  - Patient reports good compliance with blood pressure medications  - Denies medication S-E   - Smoking Status noted   - She denies new onset of: chest pain, exercise intolerance, shortness of breath, dizziness, visual changes, headache, lower extremity swelling or claudication.   Last 3 blood pressure readings in our office are as follows: BP Readings from Last 3 Encounters:  07/10/18 140/88  07/07/18 130/82  05/10/18 (!) 150/84    Filed Weights   07/10/18 0930  Weight: 230 lb (104.3 kg)     Pulse Readings from Last 3 Encounters:  07/10/18 85  07/07/18 83  05/10/18 80   BMI Readings from Last 3  Encounters:  07/10/18 40.74 kg/m  07/07/18 40.92 kg/m  05/08/18 41.63 kg/m     Patient Care Team    Relationship Specialty Notifications Start End  Mellody Dance, DO PCP - General Family Medicine  07/07/18   Gatha Mayer, MD Consulting Physician Gastroenterology  07/07/18   Evaristo Bury, MD  Obstetrics and Gynecology  07/07/18    Comment: Uro-gynecologist specialist at [facility], [facility], [place] Referring Physician Optometry  07/07/18   Wallene Huh, [place] Consulting Physician Podiatry  07/07/18   Delrae Rend, MD Consulting Physician Endocrinology  07/07/18    Comment: For her diabetes    Full medical history updated and reviewed in the office today  Patient Active Problem List   Diagnosis Date Noted  . Type 2 diabetes mellitus with diabetic nephropathy ([place]) 10/02/2017    Priority: High  . Hypertension associated with diabetes ([place]) 09/19/2007    Priority: High  . Hyperlipidemia associated with type 2 diabetes mellitus ([place]) 08/25/2007    Priority: High  . Morbid obesity ([place]) 07/07/2018    Priority: Medium  . Hepatic steatosis 05/08/2018    Priority: Medium  . Melanoma ([place]) 09/17/2013    Priority: Medium  . Anemia 05/26/2007    Priority: Medium  . 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 05/22/2007    Priority: Medium  . History of  ALCOHOL ABUSE 05/22/2007    Priority: Medium  . SEIZURE DISORDER 05/22/2007    Priority: Medium  . GERD (gastroesophageal reflux disease) 07/07/2018    Priority: Low  . Hypothyroidism 04/01/2013    Priority: Low  . INSOMNIA, CHRONIC 01/02/2010    Priority: Low  . 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 ASTHMA 05/22/2007    Priority: Low  . Vitamin D insufficiency 07/10/2018  . Type 2 diabetes mellitus with stage 3 chronic kidney disease, without long-term current use of insulin ([place]) 07/10/2018  . Dysphagia 07/07/2018  . Hypercalcemia 07/07/2018  . Multinodular goiter  07/07/2018  . Fatty liver 07/07/2018  . Dyslipidemia 07/07/2018  . Essential hypertension 07/07/2018  . Insomnia 07/07/2018  . Chronic pelvic pain in female 07/07/2018  . Chronic bilateral low back pain without sciatica 07/07/2018  . Dyspareunia in female 07/07/2018  . 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 Hashimoto's disease 07/07/2018  .  Myalgia 07/07/2018  . Sympathetically maintained pain 07/07/2018  . Status post hysterectomy with oophorectomy:  1998 or so;  non- cancerous reasons 07/07/2018  . AKI (acute kidney injury) ([place]) 05/08/2018  . Asthma without status asthmaticus 10/02/2017  . HLD (hyperlipidemia) 10/02/2017  . Anxiety disorder 10/02/2017  . Chest pain 10/22/2013  . Abnormal ECG 10/12/2013  . Dysphagia, unspecified(787.20) 04/01/2013  . Thyroid nodule 03/30/2013  . Nocturia 08/06/2011  . Pelvic pain 08/06/2011  . EDEMA 01/02/2010  . SYNCOPE 11/05/2007  . SIALADENITIS 11/03/2007  . ADJUSTMENT DISORDER WITH ANXIETY 10/27/2007  . Benign neoplasm of skin 09/19/2007  . OTHER ABNORMAL GLUCOSE 08/25/2007  . DEPRESSION 05/22/2007  . ALLERGIC RHINITIS 05/22/2007  . ALLERGY, HX OF 05/22/2007    Past Medical History:  Diagnosis Date  . Allergic rhinitis   . Allergy   . Anxiety   . Asthma   . Depression   . Diabetes mellitus ([place])   . GERD (gastroesophageal reflux disease)   . Hepatic steatosis 05/08/2018  . Hx of echocardiogram    a. Echo (7/15): Mild focal basal hypertrophy of the septum, EF 55-60%, normal wall motion, grade 1 diastolic dysfunction  . Hyperlipidemia   . Hypertension   . Hypothyroid   . Melanoma ([place]) 2015   back  . Seizure disorder ([place])   . Seizures ([place])    seizures caused by antidepressants; not seizure in 12 years  . Sialadenitis    seen by Dr Lucia Gaskins in the past  . Sleep apnea    lost 40 pounds; does not use cpap    Past Surgical History:  Procedure Laterality Date  . ABDOMINAL HYSTERECTOMY  1998  . BILE DUCT EXPLORATION  1995   . C-section x3    . CESAREAN SECTION    . CHOLECYSTECTOMY  1995  . LAPAROSCOPIC LYSIS INTESTINAL ADHESIONS  2001, 2000  . LAPAROSCOPIC OOPHERECTOMY  1999  . MELANOMA EXCISION  2015   back   . TUBAL LIGATION  1998    Social History   Tobacco Use  . Smoking status: Never Smoker  . Smokeless tobacco: Never Used  Substance Use Topics  . Alcohol use: Yes    Alcohol/week: 0.0 standard drinks    Comment: Rarely    Family Hx: Family History  Problem Relation Age of Onset  . Alcohol abuse Unknown   . Autism Daughter        and vitamin K deficiency  . Coronary artery disease Father        MI in his 42s  . Alcohol abuse Father   . Heart disease Father   . Diabetes Father   . Hyperlipidemia Father   . Hypertension Father   . Diabetes Unknown        multiple family members  . Depression Mother   . Obesity Sister        "sibling"  . Colon cancer Neg Hx      Medications: Current Outpatient Medications  Medication Sig Dispense Refill  . albuterol (PROVENTIL HFA;VENTOLIN HFA) 108 (90 BASE) MCG/ACT inhaler Inhale 1 puff into the lungs every 6 (six) hours as needed for wheezing or shortness of breath.    Marland Kitchen aspirin 81 MG tablet Take 81 mg by mouth daily.    Marland Kitchen atorvastatin (LIPITOR) 20 MG tablet Take 20 mg by mouth daily.    . eszopiclone (LUNESTA) 2 MG TABS tablet Take 1 tablet (2 mg total) by mouth at bedtime as needed for sleep. Take immediately before bedtime 90 tablet  0  . fluticasone (FLOVENT HFA) 220 MCG/ACT inhaler Inhale 1 puff into the lungs 2 (two) times daily.    Marland Kitchen levothyroxine (SYNTHROID, LEVOTHROID) 50 MCG tablet Take 50 mcg by mouth daily.    Marland Kitchen loratadine (CLARITIN) 10 MG tablet Take 10 mg by mouth daily.    . metFORMIN (GLUCOPHAGE) 1000 MG tablet Take 1,000 mg by mouth 2 (two) times daily.    . montelukast (SINGULAIR) 10 MG tablet Take 10 mg by mouth at bedtime.     Marland Kitchen omeprazole (PRILOSEC) 40 MG capsule Take 40 mg by mouth daily.    . ONGLYZA 5 MG TABS tablet  Take 5 mg by mouth daily.  3  . oxybutynin (DITROPAN) 5 MG tablet Take 5 mg by mouth daily.    Marland Kitchen triamterene-hydrochlorothiazide (MAXZIDE-25) 37.5-25 MG per tablet Take 0.5 tablets by mouth daily.    Marland Kitchen venlafaxine XR (EFFEXOR-XR) 75 MG 24 hr capsule Take 75 mg by mouth daily.    . Cholecalciferol (VITAMIN D) 2000 units CAPS 1 tab p.o. daily 30 capsule   . olmesartan (BENICAR) 5 MG tablet Take 1 tablet (5 mg total) by mouth daily. 90 tablet 1   No current facility-administered medications for this visit.     Allergies:  Allergies  Allergen Reactions  . Dulaglutide Other (See Comments)    Abd pain/Dizziness   . Lexapro [Escitalopram] Other (See Comments)    Causes grand mal seizures; all antidepressants  . Morphine   . Morphine Sulfate Rash     Review of Systems: General:   No F/C, wt loss Pulm:   No DIB, SOB, pleuritic chest pain Card:  No CP, palpitations Abd:  No n/v/d or pain Ext:  No inc edema from baseline  Objective:  Blood pressure 140/88, pulse 85, height '5\' 3"'$  (1.6 m), weight 230 lb (104.3 kg), SpO2 100 %. Body mass index is 40.74 kg/m. Gen:   Well NAD, A and O *3 HEENT:    [place]/AT, EOMI,  MMM Lungs:   Normal work of breathing. CTA B/L, no Wh, rhonchi Heart:   RRR, S1, S2 WNL's, no MRG Abd:   No gross distention Exts:    warm, pink,  Brisk capillary refill, warm and well perfused.  Psych:    No HI/SI, judgement and insight good, Euthymic mood. Full Affect.   Recent Results (from the past 2160 hour(s))  Lipase, blood     Status: None   Collection Time: 05/08/18 12:37 AM  Result Value Ref Range   Lipase 32 11 - 51 U/L    Comment: Performed at [facility], [address] [place], [place] [address]  Comprehensive metabolic panel     Status: Abnormal   Collection Time: 05/08/18 12:37 AM  Result Value Ref Range   Sodium 130 (L) 135 - 145 mmol/L   Potassium 3.2 (L) 3.5 - 5.1 mmol/L   Chloride 96 (L) 98 - 111 mmol/L   CO2 23 22 - 32 mmol/L   Glucose, Bld  166 (H) 70 - 99 mg/dL   BUN 24 (H) 6 - 20 mg/dL   Creatinine, Ser 1.61 (H) 0.44 - 1.00 mg/dL   Calcium 7.8 (L) 8.9 - 10.3 mg/dL   Total Protein 6.6 6.5 - 8.1 g/dL   Albumin 3.4 (L) 3.5 - 5.0 g/dL   AST 14 (L) 15 - 41 U/L   ALT 26 0 - 44 U/L   Alkaline Phosphatase 58 38 - 126 U/L   Total Bilirubin 0.7 0.3 -  1.2 mg/dL   GFR calc non Af Amer 35 (L) >60 mL/min   GFR calc Af Amer 41 (L) >60 mL/min    Comment: (NOTE) The eGFR has been calculated using the CKD EPI equation. This calculation has not been validated in all clinical situations. eGFR's persistently <60 mL/min signify possible Chronic Kidney Disease.    Anion gap 11 5 - 15    Comment: Performed at [facility] [address], [place], [place] [address]  CBC     Status: Abnormal   Collection Time: 05/08/18 12:37 AM  Result Value Ref Range   WBC 9.9 4.0 - 10.5 K/uL   RBC 3.93 3.87 - 5.11 MIL/uL   Hemoglobin 11.8 (L) 12.0 - 15.0 g/dL   HCT 36.5 36.0 - 46.0 %   MCV 92.9 78.0 - 100.0 fL   MCH 30.0 26.0 - 34.0 pg   MCHC 32.3 30.0 - 36.0 g/dL   RDW 12.4 11.5 - 15.5 %   Platelets 359 150 - 400 K/uL    Comment: Performed at [facility], [place] [address] [place], [place] [address]  Magnesium     Status: Abnormal   Collection Time: 05/08/18 12:37 AM  Result Value Ref Range   Magnesium 1.4 (L) 1.7 - 2.4 mg/dL    Comment: Performed at [facility] [address] [place], [place] [address]  Phosphorus     Status: Abnormal   Collection Time: 05/08/18 12:37 AM  Result Value Ref Range   Phosphorus 2.1 (L) 2.5 - 4.6 mg/dL    Comment: Performed at [facility] [address], [place], [place] [address]  I-Stat beta hCG blood, ED     Status: Abnormal   Collection Time: 05/08/18 12:43 AM  Result Value Ref Range   I-stat hCG, quantitative 5.5 (H) <5 mIU/mL   Comment 3            Comment:   GEST. AGE      CONC.  (mIU/mL)   <=1 WEEK        5 - 50     2 WEEKS       50 - 500     3 WEEKS       100 - 10,000      4 WEEKS     1,000 - 30,000        FEMALE AND NON-PREGNANT FEMALE:     LESS THAN 5 mIU/mL   Urinalysis, Routine w reflex microscopic     Status: Abnormal   Collection Time: 05/08/18  1:14 AM  Result Value Ref Range   Color, Urine YELLOW YELLOW   APPearance HAZY (A) CLEAR   Specific Gravity, Urine 1.019 1.005 - 1.030   pH 5.0 5.0 - 8.0   Glucose, UA NEGATIVE NEGATIVE mg/dL   Hgb urine dipstick SMALL (A) NEGATIVE   Bilirubin Urine NEGATIVE NEGATIVE   Ketones, ur NEGATIVE NEGATIVE mg/dL   Protein, ur NEGATIVE NEGATIVE mg/dL   Nitrite NEGATIVE NEGATIVE   Leukocytes, UA TRACE (A) NEGATIVE   RBC / HPF 0-5 0 - 5 RBC/hpf   WBC, UA 6-10 0 - 5 WBC/hpf   Bacteria, UA RARE (A) NONE SEEN   Squamous Epithelial / LPF 6-10 0 - 5   Hyaline Casts, UA PRESENT     Comment: Performed at [facility], [address], [place], [place] [address]  I-stat troponin, ED     Status: None   Collection Time: 05/08/18  4:46 AM  Result Value Ref Range   Troponin i, poc 0.00 0.00 - 0.08 ng/mL   Comment 3            Comment: Due to the release kinetics of cTnI, a negative result within the first hours of the onset of symptoms does not rule out myocardial infarction with certainty. If myocardial infarction is still suspected, repeat the test at appropriate intervals.   Glucose, capillary     Status: None   Collection Time: 05/08/18 12:41 PM  Result Value Ref Range   Glucose-Capillary 85 70 - 99 mg/dL  Glucose, capillary     Status: None   Collection Time: 05/08/18  4:53 PM  Result Value Ref Range   Glucose-Capillary 84 70 - 99 mg/dL  Glucose, capillary     Status: None   Collection Time: 05/08/18 11:41 PM  Result Value Ref Range   Glucose-Capillary 75 70 - 99 mg/dL  HIV antibody (Routine Testing)     Status: None   Collection Time: 05/09/18  4:09 AM  Result Value Ref Range   HIV Screen 4th Generation wRfx Non Reactive Non Reactive    Comment: (NOTE) Performed At: [facility] [place], [place] [id number] Rush Farmer MD [id number]   Basic metabolic panel     Status: Abnormal   Collection Time: 05/09/18  4:09 AM  Result Value Ref Range   Sodium 140 135 - 145 mmol/L   Potassium 3.7 3.5 - 5.1 mmol/L   Chloride 111 98 - 111 mmol/L   CO2 21 (L) 22 - 32 mmol/L   Glucose, Bld 74 70 - 99 mg/dL   BUN 14 6 - 20 mg/dL   Creatinine, Ser 1.12 (H) 0.44 - 1.00 mg/dL   Calcium 7.6 (L) 8.9 - 10.3 mg/dL   GFR calc non Af Amer 55 (L) >60 mL/min   GFR calc Af Amer >60 >60 mL/min    Comment: (NOTE) The eGFR has been calculated using the CKD EPI equation. This calculation has not been validated in all clinical situations. eGFR's persistently <60 mL/min signify possible Chronic Kidney Disease.    Anion gap 8 5 - 15    Comment: Performed at [facility], [place] [address] [place] [address], [place], [place] [address]  CBC     Status: Abnormal   Collection Time: 05/09/18  4:09 AM  Result Value Ref Range   WBC 8.5 4.0 - 10.5 K/uL   RBC 3.35 (L) 3.87 - 5.11 MIL/uL   Hemoglobin 10.2 (L) 12.0 - 15.0 g/dL   HCT 30.8 (L) 36.0 - 46.0 %   MCV 91.9 78.0 - 100.0 fL   MCH 30.4 26.0 - 34.0 pg   MCHC 33.1 30.0 - 36.0 g/dL   RDW 13.0 11.5 - 15.5 %   Platelets 289 150 - 400 K/uL    Comment: Performed at [facility], [place] [address] [place], [place] [address]  Glucose, capillary     Status: None   Collection Time: 05/09/18  5:28 AM  Result Value Ref Range   Glucose-Capillary 73 70 - 99 mg/dL  Glucose, capillary     Status: None   Collection Time: 05/09/18 11:35 AM  Result Value Ref Range   Glucose-Capillary 72 70 - 99 mg/dL  Glucose, capillary     Status: None   Collection Time: 05/09/18  5:56 PM  Result Value Ref Range   Glucose-Capillary 86 70 - 99 mg/dL  Glucose, capillary     Status:  Abnormal   Collection Time: 05/09/18 11:44 PM  Result Value Ref Range   Glucose-Capillary 108 (H) 70 - 99 mg/dL  Glucose, capillary     Status: Abnormal    Collection Time: 05/10/18  6:04 AM  Result Value Ref Range   Glucose-Capillary 114 (H) 70 - 99 mg/dL  Glucose, capillary     Status: Abnormal   Collection Time: 05/10/18 12:03 PM  Result Value Ref Range   Glucose-Capillary 117 (H) 70 - 99 mg/dL  Magnesium     Status: None   Collection Time: 07/08/18  9:48 AM  Result Value Ref Range   Magnesium 1.7 1.6 - 2.3 mg/dL  Phosphorus     Status: None   Collection Time: 07/08/18  9:48 AM  Result Value Ref Range   Phosphorus 4.1 2.5 - 4.5 mg/dL  VITAMIN D 25 Hydroxy (Vit-D Deficiency, Fractures)     Status: None   Collection Time: 07/08/18  9:48 AM  Result Value Ref Range   Vit D, 25-Hydroxy 39.5 30.0 - 100.0 ng/mL    Comment: Vitamin D deficiency has been defined by the [place] practice guideline as a level of serum 25-OH vitamin D less than 20 ng/mL (1,2). The Endocrine Society went on to further define vitamin D insufficiency as a level between 21 and 29 ng/mL (2). 1. IOM (Institute of Medicine). 2010. Dietary reference    intakes for calcium and D. [place]: The    [employer]. 2. Holick MF, Binkley McComb, Bischoff-Ferrari HA, et al.    Evaluation, treatment, and prevention of vitamin D    deficiency: an Endocrine Society clinical practice    guideline. JCEM. 2011 Jul; 96(7):1911-30.   TSH     Status: None   Collection Time: 07/08/18  9:48 AM  Result Value Ref Range   TSH 2.410 0.450 - 4.500 uIU/mL  T4, free     Status: None   Collection Time: 07/08/18  9:48 AM  Result Value Ref Range   Free T4 1.36 0.82 - 1.77 ng/dL  Lipid panel     Status: Abnormal   Collection Time: 07/08/18  9:48 AM  Result Value Ref Range   Cholesterol, Total 149 100 - 199 mg/dL   Triglycerides 160 (H) 0 - 149 mg/dL   HDL 46 >39 mg/dL   VLDL Cholesterol Cal 32 5 - 40 mg/dL   LDL Calculated 71 0 - 99 mg/dL   Chol/HDL Ratio 3.2 0.0 - 4.4 ratio    Comment:                                   T. Chol/HDL  Ratio                                             Men  Women                               1/2 Avg.Risk  3.4    3.3                                   Avg.Risk  5.0    4.4  2X Avg.Risk  9.6    7.1                                3X Avg.Risk 23.4   11.0   Hemoglobin A1c     Status: Abnormal   Collection Time: 07/08/18  9:48 AM  Result Value Ref Range   Hgb A1c MFr Bld 6.8 (H) 4.8 - 5.6 %    Comment:          Prediabetes: 5.7 - 6.4          Diabetes: >6.4          Glycemic control for adults with diabetes: <7.0    Est. average glucose Bld gHb Est-mCnc 148 mg/dL  Comprehensive metabolic panel     Status: Abnormal   Collection Time: 07/08/18  9:48 AM  Result Value Ref Range   Glucose 150 (H) 65 - 99 mg/dL   BUN 21 6 - 24 mg/dL   Creatinine, Ser 1.16 (H) 0.57 - 1.00 mg/dL   GFR calc non Af Amer 54 (L) >59 mL/min/1.73   GFR calc Af Amer 62 >59 mL/min/1.73   BUN/Creatinine Ratio 18 9 - 23   Sodium 140 134 - 144 mmol/L   Potassium 4.2 3.5 - 5.2 mmol/L   Chloride 97 96 - 106 mmol/L   CO2 25 20 - 29 mmol/L   Calcium 9.6 8.7 - 10.2 mg/dL   Total Protein 7.1 6.0 - 8.5 g/dL   Albumin 4.3 3.5 - 5.5 g/dL   Globulin, Total 2.8 1.5 - 4.5 g/dL   Albumin/Globulin Ratio 1.5 1.2 - 2.2   Bilirubin Total 0.2 0.0 - 1.2 mg/dL   Alkaline Phosphatase 67 39 - 117 IU/L   AST 20 0 - 40 IU/L   ALT 38 (H) 0 - 32 IU/L  CBC with Differential/Platelet     Status: Abnormal   Collection Time: 07/08/18  9:48 AM  Result Value Ref Range   WBC 10.4 3.4 - 10.8 x10E3/uL   RBC 3.93 3.77 - 5.28 x10E6/uL   Hemoglobin 11.8 11.1 - 15.9 g/dL   Hematocrit 34.5 34.0 - 46.6 %   MCV 88 79 - 97 fL   MCH 30.0 26.6 - 33.0 pg   MCHC 34.2 31.5 - 35.7 g/dL   RDW 12.1 (L) 12.3 - 15.4 %   Platelets 381 150 - 450 x10E3/uL   Neutrophils 65 Not Estab. %   Lymphs 22 Not Estab. %   Monocytes 5 Not Estab. %   Eos 7 Not Estab. %   Basos 1 Not Estab. %   Neutrophils Absolute 6.7 1.4 - 7.0 x10E3/uL    Lymphocytes Absolute 2.3 0.7 - 3.1 x10E3/uL   Monocytes Absolute 0.6 0.1 - 0.9 x10E3/uL   EOS (ABSOLUTE) 0.7 (H) 0.0 - 0.4 x10E3/uL   Basophils Absolute 0.1 0.0 - 0.2 x10E3/uL   Immature Granulocytes 0 Not Estab. %   Immature Grans (Abs) 0.0 0.0 - 0.1 x10E3/uL

## 2018-07-14 ENCOUNTER — Encounter: Payer: Self-pay | Admitting: Family Medicine

## 2018-07-16 DIAGNOSIS — L814 Other melanin hyperpigmentation: Secondary | ICD-10-CM | POA: Diagnosis not present

## 2018-07-16 DIAGNOSIS — L821 Other seborrheic keratosis: Secondary | ICD-10-CM | POA: Diagnosis not present

## 2018-07-16 DIAGNOSIS — L738 Other specified follicular disorders: Secondary | ICD-10-CM | POA: Diagnosis not present

## 2018-07-16 DIAGNOSIS — D485 Neoplasm of uncertain behavior of skin: Secondary | ICD-10-CM | POA: Diagnosis not present

## 2018-07-28 ENCOUNTER — Encounter: Payer: Self-pay | Admitting: Family Medicine

## 2018-07-29 ENCOUNTER — Other Ambulatory Visit: Payer: Self-pay

## 2018-07-29 DIAGNOSIS — M6289 Other specified disorders of muscle: Secondary | ICD-10-CM | POA: Diagnosis not present

## 2018-07-29 DIAGNOSIS — M5442 Lumbago with sciatica, left side: Secondary | ICD-10-CM | POA: Diagnosis not present

## 2018-07-29 DIAGNOSIS — M545 Low back pain: Secondary | ICD-10-CM | POA: Diagnosis not present

## 2018-07-29 DIAGNOSIS — M47896 Other spondylosis, lumbar region: Secondary | ICD-10-CM | POA: Diagnosis not present

## 2018-07-29 DIAGNOSIS — M792 Neuralgia and neuritis, unspecified: Secondary | ICD-10-CM | POA: Diagnosis not present

## 2018-07-29 DIAGNOSIS — M47817 Spondylosis without myelopathy or radiculopathy, lumbosacral region: Secondary | ICD-10-CM | POA: Diagnosis not present

## 2018-07-29 MED ORDER — VENLAFAXINE HCL ER 75 MG PO CP24: 75.0000 mg | ORAL_CAPSULE | Freq: Every day | ORAL | 0 refills | Status: DC

## 2018-07-29 NOTE — Telephone Encounter (Signed)
We have not prescribed these medications for the patient previously.  Please review and refill if appropriate.  T. Saranya Harlin, CMA  

## 2018-08-17 ENCOUNTER — Encounter: Payer: Self-pay | Admitting: Family Medicine

## 2018-08-18 ENCOUNTER — Other Ambulatory Visit: Payer: Self-pay

## 2018-08-18 DIAGNOSIS — E1121 Type 2 diabetes mellitus with diabetic nephropathy: Secondary | ICD-10-CM

## 2018-08-18 MED ORDER — GLUCOSE BLOOD VI STRP: ORAL_STRIP | 12 refills | Status: DC

## 2018-08-18 NOTE — Telephone Encounter (Signed)
Patient requesting refill on diabetic test strips.  RX sent into the pharmacy. MPulliam, CMA/RT(R)

## 2018-08-21 DIAGNOSIS — N3281 Overactive bladder: Secondary | ICD-10-CM | POA: Diagnosis not present

## 2018-08-21 DIAGNOSIS — R102 Pelvic and perineal pain: Secondary | ICD-10-CM | POA: Diagnosis not present

## 2018-08-21 DIAGNOSIS — M629 Disorder of muscle, unspecified: Secondary | ICD-10-CM | POA: Diagnosis not present

## 2018-08-22 ENCOUNTER — Other Ambulatory Visit: Payer: Self-pay

## 2018-08-22 DIAGNOSIS — E1121 Type 2 diabetes mellitus with diabetic nephropathy: Secondary | ICD-10-CM

## 2018-08-22 MED ORDER — GLUCOSE BLOOD VI STRP: ORAL_STRIP | 12 refills | Status: DC

## 2018-08-26 ENCOUNTER — Encounter: Payer: Self-pay | Admitting: Family Medicine

## 2018-08-27 ENCOUNTER — Other Ambulatory Visit: Payer: Self-pay

## 2018-08-27 MED ORDER — FLUTICASONE PROPIONATE HFA 220 MCG/ACT IN AERO: 1.0000 | INHALATION_SPRAY | Freq: Two times a day (BID) | RESPIRATORY_TRACT | 2 refills | Status: DC

## 2018-08-27 MED ORDER — ALBUTEROL SULFATE HFA 108 (90 BASE) MCG/ACT IN AERS: 1.0000 | INHALATION_SPRAY | Freq: Four times a day (QID) | RESPIRATORY_TRACT | 1 refills | Status: DC | PRN

## 2018-08-27 NOTE — Telephone Encounter (Signed)
Patient is requesting refills on albuterol inhaler and the flovent inhaler, both were last filled by a pervious provider.  LOV 07/10/2018.  Please review and advise. MPulliam, CMA/RT(R)

## 2018-09-03 ENCOUNTER — Other Ambulatory Visit: Payer: Self-pay

## 2018-09-03 DIAGNOSIS — F39 Unspecified mood [affective] disorder: Secondary | ICD-10-CM

## 2018-09-03 MED ORDER — LEVOTHYROXINE SODIUM 50 MCG PO TABS: 50.0000 ug | ORAL_TABLET | Freq: Every day | ORAL | 0 refills | Status: DC

## 2018-09-03 MED ORDER — VENLAFAXINE HCL ER 75 MG PO CP24: 75.0000 mg | ORAL_CAPSULE | Freq: Every day | ORAL | 1 refills | Status: DC

## 2018-09-03 MED ORDER — ATORVASTATIN CALCIUM 20 MG PO TABS: 20.0000 mg | ORAL_TABLET | Freq: Every day | ORAL | 0 refills | Status: DC

## 2018-09-03 NOTE — Telephone Encounter (Signed)
Pharmacy sent refill request for synthroid and atorvastatin, medication was last filled by a pervious provider.  LOV 07/10/2018.  Please review and advise. MPulliam, CMA/RT(R)

## 2018-09-06 ENCOUNTER — Encounter: Payer: Self-pay | Admitting: Family Medicine

## 2018-09-08 ENCOUNTER — Other Ambulatory Visit: Payer: Self-pay

## 2018-09-08 ENCOUNTER — Encounter: Payer: Self-pay | Admitting: Family Medicine

## 2018-09-08 DIAGNOSIS — E1121 Type 2 diabetes mellitus with diabetic nephropathy: Secondary | ICD-10-CM

## 2018-09-08 MED ORDER — ONGLYZA 5 MG PO TABS: 5.0000 mg | ORAL_TABLET | Freq: Every day | ORAL | 1 refills | Status: DC

## 2018-09-08 MED ORDER — GLUCOSE BLOOD VI STRP: ORAL_STRIP | 12 refills | Status: AC

## 2018-09-08 NOTE — Telephone Encounter (Signed)
Pharmacy sent refill request for Onglyza, medication last filled by a pervious provider.  LOV 07/10/2018.  Please review and advise. MPulliam, CMA/RT(R)

## 2018-09-12 ENCOUNTER — Other Ambulatory Visit: Payer: Self-pay | Admitting: Family Medicine

## 2018-09-12 MED ORDER — SITAGLIPTIN PHOSPHATE 100 MG PO TABS: 100.0000 mg | ORAL_TABLET | Freq: Every day | ORAL | 1 refills | Status: DC

## 2018-09-25 DIAGNOSIS — M25561 Pain in right knee: Secondary | ICD-10-CM | POA: Diagnosis not present

## 2018-09-25 DIAGNOSIS — M25571 Pain in right ankle and joints of right foot: Secondary | ICD-10-CM | POA: Diagnosis not present

## 2018-09-25 DIAGNOSIS — I10 Essential (primary) hypertension: Secondary | ICD-10-CM | POA: Diagnosis not present

## 2018-09-29 DIAGNOSIS — N952 Postmenopausal atrophic vaginitis: Secondary | ICD-10-CM | POA: Diagnosis not present

## 2018-09-29 DIAGNOSIS — N941 Unspecified dyspareunia: Secondary | ICD-10-CM | POA: Diagnosis not present

## 2018-09-29 DIAGNOSIS — N3946 Mixed incontinence: Secondary | ICD-10-CM | POA: Diagnosis not present

## 2018-09-29 DIAGNOSIS — N3941 Urge incontinence: Secondary | ICD-10-CM | POA: Diagnosis not present

## 2018-10-03 ENCOUNTER — Other Ambulatory Visit: Payer: Self-pay | Admitting: Family Medicine

## 2018-10-03 DIAGNOSIS — Z1231 Encounter for screening mammogram for malignant neoplasm of breast: Secondary | ICD-10-CM

## 2018-10-05 ENCOUNTER — Other Ambulatory Visit: Payer: Self-pay | Admitting: Family Medicine

## 2018-10-05 DIAGNOSIS — G4700 Insomnia, unspecified: Secondary | ICD-10-CM

## 2018-10-06 ENCOUNTER — Other Ambulatory Visit: Payer: 59

## 2018-10-06 NOTE — Telephone Encounter (Signed)
Dr. Raliegh Scarlet patient.  LOV 07/10/2018 and per that OV note patient controlled on Lunesta and tolerating medication well.  Medication last written on 07/07/2018 for #90 no refills.  [place] controlled substance data base reviewed and no aberrancies noted.  Please review and advise. MPulliam, CMA/RT(R)

## 2018-10-06 NOTE — Telephone Encounter (Signed)
Please request future refills from Dr. Raliegh Scarlet

## 2018-10-09 ENCOUNTER — Ambulatory Visit: Payer: 59 | Admitting: Family Medicine

## 2018-10-10 ENCOUNTER — Encounter: Payer: Self-pay | Admitting: Family Medicine

## 2018-10-17 ENCOUNTER — Other Ambulatory Visit (INDEPENDENT_AMBULATORY_CARE_PROVIDER_SITE_OTHER): Payer: 59

## 2018-10-17 DIAGNOSIS — I10 Essential (primary) hypertension: Secondary | ICD-10-CM | POA: Diagnosis not present

## 2018-10-17 DIAGNOSIS — E1159 Type 2 diabetes mellitus with other circulatory complications: Secondary | ICD-10-CM | POA: Diagnosis not present

## 2018-10-17 DIAGNOSIS — E039 Hypothyroidism, unspecified: Secondary | ICD-10-CM

## 2018-10-17 DIAGNOSIS — E1122 Type 2 diabetes mellitus with diabetic chronic kidney disease: Secondary | ICD-10-CM

## 2018-10-17 DIAGNOSIS — N183 Chronic kidney disease, stage 3 (moderate): Secondary | ICD-10-CM

## 2018-10-17 DIAGNOSIS — E559 Vitamin D deficiency, unspecified: Secondary | ICD-10-CM

## 2018-10-17 DIAGNOSIS — K760 Fatty (change of) liver, not elsewhere classified: Secondary | ICD-10-CM

## 2018-10-17 DIAGNOSIS — I152 Hypertension secondary to endocrine disorders: Secondary | ICD-10-CM

## 2018-10-17 DIAGNOSIS — E785 Hyperlipidemia, unspecified: Secondary | ICD-10-CM

## 2018-10-17 DIAGNOSIS — G4700 Insomnia, unspecified: Secondary | ICD-10-CM

## 2018-10-17 DIAGNOSIS — E1121 Type 2 diabetes mellitus with diabetic nephropathy: Secondary | ICD-10-CM

## 2018-10-17 DIAGNOSIS — D649 Anemia, unspecified: Secondary | ICD-10-CM

## 2018-10-17 DIAGNOSIS — E1169 Type 2 diabetes mellitus with other specified complication: Secondary | ICD-10-CM

## 2018-10-18 LAB — CBC WITH DIFFERENTIAL/PLATELET
Basophils Absolute: 0.1 x10E3/uL (ref 0.0–0.2)
Basos: 1 %
EOS (ABSOLUTE): 0.4 x10E3/uL (ref 0.0–0.4)
Eos: 4 %
Hematocrit: 35.4 % (ref 34.0–46.6)
Hemoglobin: 12 g/dL (ref 11.1–15.9)
Immature Grans (Abs): 0 x10E3/uL (ref 0.0–0.1)
Immature Granulocytes: 0 %
Lymphocytes Absolute: 2.4 x10E3/uL (ref 0.7–3.1)
Lymphs: 25 %
MCH: 30 pg (ref 26.6–33.0)
MCHC: 33.9 g/dL (ref 31.5–35.7)
MCV: 89 fL (ref 79–97)
Monocytes Absolute: 0.5 x10E3/uL (ref 0.1–0.9)
Monocytes: 5 %
Neutrophils Absolute: 6.3 x10E3/uL (ref 1.4–7.0)
Neutrophils: 65 %
Platelets: 378 x10E3/uL (ref 150–450)
RBC: 4 x10E6/uL (ref 3.77–5.28)
RDW: 12.5 % (ref 11.7–15.4)
WBC: 9.7 x10E3/uL (ref 3.4–10.8)

## 2018-10-18 LAB — LIPID PANEL
Chol/HDL Ratio: 3.6 ratio (ref 0.0–4.4)
Cholesterol, Total: 163 mg/dL (ref 100–199)
HDL: 45 mg/dL (ref >39)
LDL Calculated: 82 mg/dL (ref 0–99)
Triglycerides: 179 mg/dL — ABNORMAL HIGH (ref 0–149)
VLDL Cholesterol Cal: 36 mg/dL (ref 5–40)

## 2018-10-18 LAB — TSH: TSH: 3.53 u[IU]/mL (ref 0.450–4.500)

## 2018-10-18 LAB — COMPREHENSIVE METABOLIC PANEL WITH GFR
ALT: 40 IU/L — ABNORMAL HIGH (ref 0–32)
AST: 21 IU/L (ref 0–40)
Albumin/Globulin Ratio: 1.4 (ref 1.2–2.2)
Albumin: 4.1 g/dL (ref 3.8–4.9)
Alkaline Phosphatase: 65 IU/L (ref 39–117)
BUN/Creatinine Ratio: 16 (ref 9–23)
BUN: 16 mg/dL (ref 6–24)
Bilirubin Total: 0.3 mg/dL (ref 0.0–1.2)
CO2: 23 mmol/L (ref 20–29)
Calcium: 9.1 mg/dL (ref 8.7–10.2)
Chloride: 98 mmol/L (ref 96–106)
Creatinine, Ser: 0.98 mg/dL (ref 0.57–1.00)
GFR calc Af Amer: 75 mL/min/1.73 (ref >59)
GFR calc non Af Amer: 65 mL/min/1.73 (ref >59)
Globulin, Total: 2.9 g/dL (ref 1.5–4.5)
Glucose: 178 mg/dL — ABNORMAL HIGH (ref 65–99)
Potassium: 4.5 mmol/L (ref 3.5–5.2)
Sodium: 138 mmol/L (ref 134–144)
Total Protein: 7 g/dL (ref 6.0–8.5)

## 2018-10-18 LAB — T4, FREE: FREE T4: 1.39 ng/dL (ref 0.82–1.77)

## 2018-10-18 LAB — HEMOGLOBIN A1C
Est. average glucose Bld gHb Est-mCnc: 157 mg/dL
Hgb A1c MFr Bld: 7.1 % — ABNORMAL HIGH (ref 4.8–5.6)

## 2018-10-18 LAB — VITAMIN B12: Vitamin B-12: 1223 pg/mL (ref 232–1245)

## 2018-10-18 LAB — VITAMIN D 25 HYDROXY (VIT D DEFICIENCY, FRACTURES): Vit D, 25-Hydroxy: 29.6 ng/mL — ABNORMAL LOW (ref 30.0–100.0)

## 2018-10-21 ENCOUNTER — Encounter: Payer: Self-pay | Admitting: Family Medicine

## 2018-10-21 ENCOUNTER — Ambulatory Visit: Payer: 59 | Admitting: Family Medicine

## 2018-10-21 VITALS — BP 128/76 | HR 98 | Temp 98.3°F | Ht 63.0 in | Wt 237.5 lb

## 2018-10-21 DIAGNOSIS — E1159 Type 2 diabetes mellitus with other circulatory complications: Secondary | ICD-10-CM | POA: Diagnosis not present

## 2018-10-21 DIAGNOSIS — E039 Hypothyroidism, unspecified: Secondary | ICD-10-CM

## 2018-10-21 DIAGNOSIS — N183 Chronic kidney disease, stage 3 (moderate): Secondary | ICD-10-CM

## 2018-10-21 DIAGNOSIS — E1169 Type 2 diabetes mellitus with other specified complication: Secondary | ICD-10-CM

## 2018-10-21 DIAGNOSIS — E1121 Type 2 diabetes mellitus with diabetic nephropathy: Secondary | ICD-10-CM | POA: Diagnosis not present

## 2018-10-21 DIAGNOSIS — E1122 Type 2 diabetes mellitus with diabetic chronic kidney disease: Secondary | ICD-10-CM | POA: Diagnosis not present

## 2018-10-21 DIAGNOSIS — I152 Hypertension secondary to endocrine disorders: Secondary | ICD-10-CM

## 2018-10-21 DIAGNOSIS — E785 Hyperlipidemia, unspecified: Secondary | ICD-10-CM

## 2018-10-21 DIAGNOSIS — K760 Fatty (change of) liver, not elsewhere classified: Secondary | ICD-10-CM

## 2018-10-21 DIAGNOSIS — I10 Essential (primary) hypertension: Secondary | ICD-10-CM
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
## 2018-10-21 NOTE — Patient Instructions (Addendum)
Activity Goal: Exercise six days per week, 30 minutes per day.   Meal Planning Goal: Meal Prep 30 minutes the day before for the next day.    Take your fasting blood sugar after you eat especially unhealthfully, and after you eat especially healthfully.   Look into meditations for mindful eating.   Begin tracking your nutritional intake using the LoseIt app or the MyFitnessPal app.     Diabetes Mellitus and Standards of Medical Care  Managing diabetes (diabetes mellitus) can be complicated. Your diabetes treatment may be managed by a team of health care providers, including:  A diet and nutrition specialist (registered dietitian).  A nurse.  A certified diabetes educator (CDE).  A diabetes specialist (endocrinologist).  An eye doctor.  A primary care provider.  A dentist.  Your health care providers follow a schedule in order to help you get the best quality of care. The following schedule is a general guideline for your diabetes management plan. Your health care providers may also give you more specific instructions.  HbA1c (hemoglobin A1c) test This test provides information about blood sugar (glucose) control over the previous 2-3 months. It is used to check whether your diabetes management plan needs to be adjusted.  If you are meeting your treatment goals, this test is done at least 2 times a year.  If you are not meeting treatment goals or if your treatment goals have changed, this test is done 4 times a year.  Blood pressure test  This test is done at every routine medical visit. For most people, the goal is less than 130/80. Ask your health care provider what your goal blood pressure should be.  Dental and eye exams  Visit your dentist two times a year.  If you have type 1 diabetes, get an eye exam 3-5 years after you are diagnosed, and then once a year after your first exam. ? If you were diagnosed with type 1 diabetes as a child, get an eye exam when  you are age 29 or older and have had diabetes for 3-5 years. After the first exam, you should get an eye exam once a year.  If you have type 2 diabetes, have an eye exam as soon as you are diagnosed, and then once a year after your first exam.  Foot care exam  Visual foot exams are done at every routine medical visit. The exams check for cuts, bruises, redness, blisters, sores, or other problems with the feet.  A complete foot exam is done by your health care provider once a year. This exam includes an inspection of the structure and skin of your feet, and a check of the pulses and sensation in your feet. ? Type 1 diabetes: Get your first exam 3-5 years after diagnosis. ? Type 2 diabetes: Get your first exam as soon as you are diagnosed.  Check your feet every day for cuts, bruises, redness, blisters, or sores. If you have any of these or other problems that are not healing, contact your health care provider.  Kidney function test (urine microalbumin)  This test is done once a year. ? Type 1 diabetes: Get your first test 5 years after diagnosis. ? Type 2 diabetes: Get your first test as soon as you are diagnosed._  If you have chronic kidney disease (CKD), get a serum creatinine and estimated glomerular filtration rate (eGFR) test once a year.  Lipid profile (cholesterol, HDL, LDL, triglycerides)  This test should be done when you are  diagnosed with diabetes, and every 5 years after the first test. If you are on medicines to lower your cholesterol, you may need to get this test done every year. ? The goal for LDL is less than 100 mg/dL (5.5 mmol/L). If you are at high risk, the goal is less than 70 mg/dL (3.9 mmol/L). ? The goal for HDL is 40 mg/dL (2.2 mmol/L) for men and 50 mg/dL(2.8 mmol/L) for women. An HDL cholesterol of 60 mg/dL (3.3 mmol/L) or higher gives some protection against heart disease. ? The goal for triglycerides is less than 150 mg/dL (8.3 mmol/L).  Immunizations  The  yearly flu (influenza) vaccine is recommended for everyone 6 months or older who has diabetes.  The pneumonia (pneumococcal) vaccine is recommended for everyone 2 years or older who has diabetes. If you are 59 or older, you may get the pneumonia vaccine as a series of two separate shots.  The hepatitis B vaccine is recommended for adults shortly after they have been diagnosed with diabetes.  The Tdap (tetanus, diphtheria, and pertussis) vaccine should be given: ? According to normal childhood vaccination schedules, for children. ? Every 10 years, for adults who have diabetes.  The shingles vaccine is recommended for people who have had chicken pox and are 50 years or older.  Mental and emotional health  Screening for symptoms of eating disorders, anxiety, and depression is recommended at the time of diagnosis and afterward as needed. If your screening shows that you have symptoms (you have a positive screening result), you may need further evaluation and be referred to a mental health care provider.  Diabetes self-management education  Education about how to manage your diabetes is recommended at diagnosis and ongoing as needed.  Treatment plan  Your treatment plan will be reviewed at every medical visit.  Summary  Managing diabetes (diabetes mellitus) can be complicated. Your diabetes treatment may be managed by a team of health care providers.  Your health care providers follow a schedule in order to help you get the best quality of care.  Standards of care including having regular physical exams, blood tests, blood pressure monitoring, immunizations, screening tests, and education about how to manage your diabetes.  Your health care providers may also give you more specific instructions based on your individual health.      Type 2 Diabetes Mellitus, Self Care, Adult Caring for yourself after you have been diagnosed with type 2 diabetes (type 2 diabetes mellitus) means  keeping your blood sugar (glucose) under control with a balance of:  Nutrition.  Exercise.  Lifestyle changes.  Medicines or insulin, if necessary.  Support from your team of health care providers and others.  The following information explains what you need to know to manage your diabetes at home. What do I need to do to manage my blood glucose?  Check your blood glucose every day, as often as told by your health care provider.  Contact your health care provider if your blood glucose is above your target for 2 tests in a row.  Have your A1c (hemoglobin A1c) level checked at least two times a year, or as often as told by your health care provider. Your health care provider will set individualized treatment goals for you. Generally, the goal of treatment is to maintain the following blood glucose levels:  Before meals (preprandial): 80-130 mg/dL (4.4-7.2 mmol/L).  After meals (postprandial): below 180 mg/dL (10 mmol/L).  A1c level: less than 7%.  What do I need to  know about hyperglycemia and hypoglycemia? What is hyperglycemia? Hyperglycemia, also called high blood glucose, occurs when blood glucose is too high.Make sure you know the early signs of hyperglycemia, such as:  Increased thirst.  Hunger.  Feeling very tired.  Needing to urinate more often than usual.  Blurry vision.  What is hypoglycemia? Hypoglycemia, also called low blood glucose, occurswith a blood glucose level at or below 70 mg/dL (3.9 mmol/L). The risk for hypoglycemia increases during or after exercise, during sleep, during illness, and when skipping meals or not eating for a long time (fasting). It is important to know the symptoms of hypoglycemia and treat it right away. Always have a 15-gram rapid-acting carbohydrate snack with you to treat low blood glucose. Family members and close friends should also know the symptoms and should understand how to treat hypoglycemia, in case you are not able to  treat yourself. What are the symptoms of hypoglycemia? Hypoglycemia symptoms can include:  Hunger.  Anxiety.  Sweating and feeling clammy.  Confusion.  Dizziness or feeling light-headed.  Sleepiness.  Nausea.  Increased heart rate.  Headache.  Blurry vision.  Seizure.  Nightmares.  Tingling or numbness around the mouth, lips, or tongue.  A change in speech.  Decreased ability to concentrate.  A change in coordination.  Restless sleep.  Tremors or shakes.  Fainting.  Irritability.  How do I treat hypoglycemia?  If you are alert and able to swallow safely, follow the 15:15 rule:  Take 15 grams of a rapid-acting carbohydrate. Rapid-acting options include: ? 1 tube of glucose gel. ? 3 glucose pills. ? 6-8 pieces of hard candy. ? 4 oz (120 mL) of fruit juice. ? 4 oz (120 mL) of regular (not diet) soda.  Check your blood glucose 15 minutes after you take the carbohydrate.  If the repeat blood glucose level is still at or below 70 mg/dL (3.9 mmol/L), take 15 grams of a carbohydrate again.  If your blood glucose level does not increase above 70 mg/dL (3.9 mmol/L) after 3 tries, seek emergency medical care.  After your blood glucose level returns to normal, eat a meal or a snack within 1 hour.  How do I treat severe hypoglycemia? Severe hypoglycemia is when your blood glucose level is at or below 54 mg/dL (3 mmol/L). Severe hypoglycemia is an emergency. Do not wait to see if the symptoms will go away. Get medical help right away. Call your local emergency services (911 in the [place] Do not drive yourself to the hospital. If you have severe hypoglycemia and you cannot eat or drink, you may need an injection of glucagon. A family member or close friend should learn how to check your blood glucose and how to give you a glucagon injection. Ask your health care provider if you need to have an emergency glucagon injection kit available. Severe hypoglycemia may need  to be treated in a hospital. The treatment may include getting glucose through an IV tube. You may also need treatment for the cause of your hypoglycemia. Can having diabetes put me at risk for other conditions? Having diabetes can put you at risk for other long-term (chronic) conditions, such as heart disease and kidney disease. Your health care provider may prescribe medicines to help prevent complications from diabetes. These medicines may include:  Aspirin.  Medicine to lower cholesterol.  Medicine to control blood pressure.  What else can I do to manage my diabetes? Take your diabetes medicines as told  If your health care provider  prescribed insulin or diabetes medicines, take them every day.  Do not run out of insulin or other diabetes medicines that you take. Plan ahead so you always have these available.  If you use insulin, adjust your dosage based on how physically active you are and what foods you eat. Your health care provider will tell you how to adjust your dosage. Make healthy food choices  The things that you eat and drink affect your blood glucose and your insulin dosage. Making good choices helps to control your diabetes and prevent other health problems. A healthy meal plan includes eating lean proteins, complex carbohydrates, fresh fruits and vegetables, low-fat dairy products, and healthy fats. Make an appointment to see a diet and nutrition specialist (registered dietitian) to help you create an eating plan that is right for you. Make sure that you:  Follow instructions from your health care provider about eating or drinking restrictions.  Drink enough fluid to keep your urine clear or pale yellow.  Eat healthy snacks between nutritious meals.  Track the carbohydrates that you eat. Do this by reading food labels and learning the standard serving sizes of foods.  Follow your sick day plan whenever you cannot eat or drink as usual. Make this plan in advance with  your health care provider.  Stay active  Exercise regularly, as told by your health care provider. This may include:  Stretching and doing strength exercises, such as yoga or weightlifting, at least 2 times a week.  Doing at least 150 minutes of moderate-intensity or vigorous-intensity exercise each week. This could be brisk walking, biking, or water aerobics. ? Spread out your activity over at least 3 days of the week. ? Do not go more than 2 days in a row without doing some kind of physical activity.  When you start a new exercise or activity, work with your health care provider to adjust your insulin, medicines, or food intake as needed. Make healthy lifestyle choices  Do not use any tobacco products, such as cigarettes, chewing tobacco, and e-cigarettes. If you need help quitting, ask your health care provider.  If your health care provider says that alcohol is safe for you, limit alcohol intake to no more than 1 drink per day for nonpregnant women and 2 drinks per day for men. One drink equals 12 oz of beer, 5 oz of wine, or 1 oz of hard liquor.  Learn to manage stress. If you need help with this, ask your health care provider. Care for your body   Keep your immunizations up to date. In addition to getting vaccinations as told by your health care provider, it is recommended that you get vaccinated against the following illnesses: ? The flu (influenza). Get a flu shot every year. ? Pneumonia. ? Hepatitis B.  Schedule an eye exam soon after your diagnosis, and then one time every year after that.  Check your skin and feet every day for cuts, bruises, redness, blisters, or sores. Schedule a foot exam with your health care provider once every year.  Brush your teeth and gums two times a day, and floss at least one time a day. Visit your dentist at least once every 6 months.  Maintain a healthy weight. General instructions  Take over-the-counter and prescription medicines only  as told by your health care provider.  Share your diabetes management plan with people in your workplace, school, and household.  Check your urine for ketones when you are ill and as told by your  health care provider.  Ask your health care provider: ? Do I need to meet with a diabetes educator? ? Where can I find a support group for people with diabetes?  Carry a medical alert card or wear medical alert jewelry.  Keep all follow-up visits as told by your health care provider. This is important. Where to find more information: For more information about diabetes, visit:  American Diabetes Association (ADA): www.diabetes.org  American Association of Diabetes Educators (AADE): www.diabeteseducator.org/patient-resources  This information is not intended to replace advice given to you by your health care provider. Make sure you discuss any questions you have with your health care provider. Document Released: 12/26/2015 Document Revised: 02/09/2016 Document Reviewed: 10/07/2015 Elsevier Interactive Patient Education  2017 [place].      Blood Glucose Monitoring, Adult Monitoring your blood sugar (glucose) helps you manage your diabetes. It also helps you and your health care provider determine how well your diabetes management plan is working. Blood glucose monitoring involves checking your blood glucose as often as directed, and keeping a record (log) of your results over time. Why should I monitor my blood glucose? Checking your blood glucose regularly can:  Help you understand how food, exercise, illnesses, and medicines affect your blood glucose.  Let you know what your blood glucose is at any time. You can quickly tell if you are having low blood glucose (hypoglycemia) or high blood glucose (hyperglycemia).  Help you and your health care provider adjust your medicines as needed.  When should I check my blood glucose? Follow instructions from your health care provider about  how often to check your blood glucose.   This may depend on:  The type of diabetes you have.  How well-controlled your diabetes is.  Medicines you are taking.  If you have type 1 diabetes:  Check your blood glucose at least 2 times a day.  Also check your blood glucose: ? Before every insulin injection. ? Before and after exercise. ? Between meals. ? 2 hours after a meal. ? Occasionally between 2:00 a.m. and 3:00 a.m., as directed. ? Before potentially dangerous tasks, like driving or using heavy machinery. ? At bedtime.  You may need to check your blood glucose more often, up to 6-10 times a day: ? If you use an insulin pump. ? If you need multiple daily injections (MDI). ? If your diabetes is not well-controlled. ? If you are ill. ? If you have a history of severe hypoglycemia. ? If you have a history of not knowing when your blood glucose is getting low (hypoglycemia unawareness).  If you have type 2 diabetes:  If you take insulin or other diabetes medicines, check your blood glucose at least 2 times a day.  If you are on intensive insulin therapy, check your blood glucose at least 4 times a day. Occasionally, you may also need to check between 2:00 a.m. and 3:00 a.m., as directed.  Also check your blood glucose: ? Before and after exercise. ? Before potentially dangerous tasks, like driving or using heavy machinery.  You may need to check your blood glucose more often if: ? Your medicine is being adjusted. ? Your diabetes is not well-controlled. ? You are ill.  What is a blood glucose log?  A blood glucose log is a record of your blood glucose readings. It helps you and your health care provider: ? Look for patterns in your blood glucose over time. ? Adjust your diabetes management plan as needed.  Every  time you check your blood glucose, write down your result and notes about things that may be affecting your blood glucose, such as your diet and exercise for the  day.  Most glucose meters store a record of glucose readings in the meter. Some meters allow you to download your records to a computer. How do I check my blood glucose? Follow these steps to get accurate readings of your blood glucose: Supplies needed   Blood glucose meter.  Test strips for your meter. Each meter has its own strips. You must use the strips that come with your meter.  A needle to prick your finger (lancet). Do not use lancets more than once.  A device that holds the lancet (lancing device).  A journal or log book to write down your results.  Procedure  Wash your hands with soap and water.  Prick the side of your finger (not the tip) with the lancet. Use a different finger each time.  Gently rub the finger until a small drop of blood appears.  Follow instructions that come with your meter for inserting the test strip, applying blood to the strip, and using your blood glucose meter.  Write down your result and any notes.  Alternative testing sites  Some meters allow you to use areas of your body other than your finger (alternative sites) to test your blood.  If you think you may have hypoglycemia, or if you have hypoglycemia unawareness, do not use alternative sites. Use your finger instead.  Alternative sites may not be as accurate as the fingers, because blood flow is slower in these areas. This means that the result you get may be delayed, and it may be different from the result that you would get from your finger.  The most common alternative sites are: ? Forearm. ? Thigh. ? Palm of the hand.  Additional tips  Always keep your supplies with you.  If you have questions or need help, all blood glucose meters have a 24-hour hotline number that you can call. You may also contact your health care provider.  After you use a few boxes of test strips, adjust (calibrate) your blood glucose meter by following instructions that came with your  meter.    The American Diabetes Association suggests the following targets for most nonpregnant adults with diabetes.  More or less stringent glycemic goals may be appropriate for each individual.  A1C: Less than 7% A1C may also be reported as eAG: Less than 154 mg/dl Before a meal (preprandial plasma glucose): 80-130 mg/dl 1-2 hours after beginning of the meal (Postprandial plasma glucose)*: Less than 180 mg/dl  *Postprandial glucose may be targeted if A1C goals are not met despite reaching preprandial glucose goals.   GOALS in short:  The goals are for the Hgb A1C to be less than 7.0 & blood pressure to be less than 130/80.    It is recommended that all diabetics are educated on and follow a healthy diabetic diet, exercise for 30 minutes 3-4 times per week (walking, biking, swimming, or machine), monitor blood glucose readings and bring that record with you to be reviewed at your next office visit.     You should be checking fasting blood sugars- especially after you eat poorly or eat really healthy, and also check 2 hour postprandial blood sugars after largest meal of the day.    Write these down and bring in your log at each office visit.    You will need to be seen  every 3 months by the provider managing your Diabetes unless told otherwise by that provider.   You will need yearly eye exams from an eye specialist and foot exams to check the nerves of your feet.  Also, your urine should be checked yearly as well to make sure excess protein is not present.   If you are checking your blood pressure at home, please record it and bring it to your next office visit.    Follow the Dietary Approaches to Stop Hypertension (DASH) diet (3 servings of fruit and vegetables daily, whole grains, low sodium, low-fat proteins).  See below.    Lastly, when it comes to your cholesterol, the goal is to have the HDL (good cholesterol) >40, and the LDL (bad cholesterol) <100.   It is recommended that you  follow a heart healthy, low saturated and trans-fat diet and exercise for 30 minutes at least 5 times a week.     (( Check out the DASH diet = 1.5 Gram Low Sodium Diet   A 1.5 gram sodium diet restricts the amount of sodium in the diet to no more than 1.5 g or 1500 mg daily.  The American Heart Association recommends Americans over the age of 62 to consume no more than 1500 mg of sodium each day to reduce the risk of developing high blood pressure.  Research also shows that limiting sodium may reduce heart attack and stroke risk.  Many foods contain sodium for flavor and sometimes as a preservative.  When the amount of sodium in a diet needs to be low, it is important to know what to look for when choosing foods and drinks.  The following includes some information and guidelines to help make it easier for you to adapt to a low sodium diet.    QUICK TIPS  Do not add salt to food.  Avoid convenience items and fast food.  Choose unsalted snack foods.  Buy lower sodium products, often labeled as "lower sodium" or "no salt added."  Check food labels to learn how much sodium is in 1 serving.  When eating at a restaurant, ask that your food be prepared with less salt or none, if possible.    READING FOOD LABELS FOR SODIUM INFORMATION  The nutrition facts label is a good place to find how much sodium is in foods. Look for products with no more than 400 mg of sodium per serving.  Remember that 1.5 g = 1500 mg.  The food label may also list foods as:  Sodium-free: Less than 5 mg in a serving.  Very low sodium: 35 mg or less in a serving.  Low-sodium: 140 mg or less in a serving.  Light in sodium: 50% less sodium in a serving. For example, if a food that usually has 300 mg of sodium is changed to become light in sodium, it will have 150 mg of sodium.  Reduced sodium: 25% less sodium in a serving. For example, if a food that usually has 400 mg of sodium is changed to reduced sodium, it will have 300 mg  of sodium.    CHOOSING FOODS  Grains  Avoid: Salted crackers and snack items. Some cereals, including instant hot cereals. Bread stuffing and biscuit mixes. Seasoned rice or pasta mixes.  Choose: Unsalted snack items. Low-sodium cereals, oats, puffed wheat and rice, shredded wheat. English muffins and bread. Pasta.  Meats  Avoid: Salted, canned, smoked, spiced, pickled meats, including fish and poultry. Bacon, ham, sausage, cold cuts,  hot dogs, anchovies.  Choose: Low-sodium canned tuna and salmon. Fresh or frozen meat, poultry, and fish.  Dairy  Avoid: Processed cheese and spreads. Cottage cheese. Buttermilk and condensed milk. Regular cheese.  Choose: Milk. Low-sodium cottage cheese. Yogurt. Sour cream. Low-sodium cheese.  Fruits and Vegetables  Avoid: Regular canned vegetables. Regular canned tomato sauce and paste. Frozen vegetables in sauces. Olives. Angie Fava. Relishes. Sauerkraut.  Choose: Low-sodium canned vegetables. Low-sodium tomato sauce and paste. Frozen or fresh vegetables. Fresh and frozen fruit.  Condiments  Avoid: Canned and packaged gravies. Worcestershire sauce. Tartar sauce. Barbecue sauce. Soy sauce. Steak sauce. Ketchup. Onion, garlic, and table salt. Meat flavorings and tenderizers.  Choose: Fresh and dried herbs and spices. Low-sodium varieties of mustard and ketchup. Lemon juice. Tabasco sauce. Horseradish.    SAMPLE 1.5 GRAM SODIUM MEAL PLAN:   Breakfast / Sodium (mg)  1 cup low-fat milk / 143 mg  1 whole-wheat English muffin / 240 mg  1 tbs heart-healthy margarine / 153 mg  1 hard-boiled egg / 139 mg  1 small orange / 0 mg  Lunch / Sodium (mg)  1 cup raw carrots / 76 mg  2 tbs no salt added peanut butter / 5 mg  2 slices whole-wheat bread / 270 mg  1 tbs jelly / 6 mg   cup red grapes / 2 mg  Dinner / Sodium (mg)  1 cup whole-wheat pasta / 2 mg  1 cup low-sodium tomato sauce / 73 mg  3 oz lean ground beef / 57 mg  1 small side salad (1 cup raw spinach  leaves,  cup cucumber,  cup yellow bell pepper) with 1 tsp olive oil and 1 tsp red wine vinegar / 25 mg  Snack / Sodium (mg)  1 container low-fat vanilla yogurt / 107 mg  3 graham cracker squares / 127 mg  Nutrient Analysis  Calories: 1745  Protein: 75 g  Carbohydrate: 237 g  Fat: 57 g  Sodium: 1425 mg  Document Released: 09/03/2005 Document Revised: 05/16/2011 Document Reviewed: 12/05/2009  ExitCare Patient Information 2012 [place].))    This information is not intended to replace advice given to you by your health care provider. Make sure you discuss any questions you have with your health care provider. Document Released: 09/06/2003 Document Revised: 03/23/2016 Document Reviewed: 02/13/2016 Elsevier Interactive Patient Education  2017 [employer].

## 2018-10-23 ENCOUNTER — Encounter: Payer: Self-pay | Admitting: Family Medicine

## 2018-10-25 ENCOUNTER — Encounter: Payer: Self-pay | Admitting: Family Medicine

## 2018-10-27 ENCOUNTER — Other Ambulatory Visit: Payer: Self-pay

## 2018-10-27 ENCOUNTER — Telehealth: Payer: Self-pay | Admitting: Family Medicine

## 2018-10-27 DIAGNOSIS — E1169 Type 2 diabetes mellitus with other specified complication: Secondary | ICD-10-CM

## 2018-10-27 DIAGNOSIS — E039 Hypothyroidism, unspecified: Secondary | ICD-10-CM

## 2018-10-27 DIAGNOSIS — E1159 Type 2 diabetes mellitus with other circulatory complications: Secondary | ICD-10-CM

## 2018-10-27 DIAGNOSIS — I10 Essential (primary) hypertension: Secondary | ICD-10-CM

## 2018-10-27 DIAGNOSIS — G4700 Insomnia, unspecified: Secondary | ICD-10-CM

## 2018-10-27 DIAGNOSIS — E559 Vitamin D deficiency, unspecified: Secondary | ICD-10-CM

## 2018-10-27 DIAGNOSIS — I152 Hypertension secondary to endocrine disorders: Secondary | ICD-10-CM

## 2018-10-27 DIAGNOSIS — E1121 Type 2 diabetes mellitus with diabetic nephropathy: Secondary | ICD-10-CM

## 2018-10-27 DIAGNOSIS — E785 Hyperlipidemia, unspecified: Secondary | ICD-10-CM

## 2018-10-27 MED ORDER — FLUTICASONE PROPIONATE HFA 220 MCG/ACT IN AERO: 1.0000 | INHALATION_SPRAY | Freq: Two times a day (BID) | RESPIRATORY_TRACT | 2 refills | Status: DC

## 2018-10-27 MED ORDER — SITAGLIPTIN PHOSPHATE 100 MG PO TABS: 100.0000 mg | ORAL_TABLET | Freq: Every day | ORAL | 0 refills | Status: DC

## 2018-10-27 MED ORDER — ALBUTEROL SULFATE HFA 108 (90 BASE) MCG/ACT IN AERS: 1.0000 | INHALATION_SPRAY | Freq: Four times a day (QID) | RESPIRATORY_TRACT | 1 refills | Status: DC | PRN

## 2018-10-27 MED ORDER — LEVOTHYROXINE SODIUM 50 MCG PO TABS: 50.0000 ug | ORAL_TABLET | Freq: Every day | ORAL | 0 refills | Status: DC

## 2018-10-27 MED ORDER — ATORVASTATIN CALCIUM 20 MG PO TABS: 20.0000 mg | ORAL_TABLET | Freq: Every day | ORAL | 0 refills | Status: DC

## 2018-10-27 MED ORDER — VITAMIN D (ERGOCALCIFEROL) 1.25 MG (50000 UNIT) PO CAPS: 50000.0000 [IU] | ORAL_CAPSULE | ORAL | 3 refills | Status: DC

## 2018-10-27 NOTE — Telephone Encounter (Signed)
RX sent in and patient notified. MPulliam, CMA/RT(R)  

## 2018-10-27 NOTE — Telephone Encounter (Signed)
Patient called states Provider discussed her taking (Vit D) & thought an Rx was to be sent to her pharmamcy:  See below:  3. Vitamin D Deficiency - 29.6 - Discussed goal range at 50-60.  - Reviewed that patient may take Vitamin D prescription once weekly or OTC daily. - Begin once weekly ergocalciferol supplementation.  but noRx send to either Pharmacies ---advised pt message would be forwarded to medical assistant to review w/ provider & some one would call her if any questions.  Per patient Rx needs to be sent to :  [id number] [place], [place] [phone] (Phone) [phone] (Fax)   --glh

## 2018-10-27 NOTE — Telephone Encounter (Signed)
Patient is requesting refill on Lunesta and singulair.  Singulair last filled by a previous provider.  Lunesta last filled 10/06/2018 for #30 no refills.  LOV 10/21/2018. MPulliam, CMA/RT(R)

## 2018-10-28 MED ORDER — ESZOPICLONE 2 MG PO TABS: 2.0000 mg | ORAL_TABLET | Freq: Every evening | ORAL | 0 refills | Status: DC | PRN

## 2018-10-28 MED ORDER — MONTELUKAST SODIUM 10 MG PO TABS: 10.0000 mg | ORAL_TABLET | Freq: Every day | ORAL | 0 refills | Status: DC

## 2018-11-10 ENCOUNTER — Ambulatory Visit
Admission: RE | Admit: 2018-11-10 | Discharge: 2018-11-10 | Disposition: A | Discharge status: Home / Self Care | Payer: 59 | Source: Ambulatory Visit | Attending: Family Medicine | Admitting: Family Medicine

## 2018-11-10 DIAGNOSIS — Z1231 Encounter for screening mammogram for malignant neoplasm of breast: Secondary | ICD-10-CM

## 2018-11-25 DIAGNOSIS — M6248 Contracture of muscle, other site: Secondary | ICD-10-CM | POA: Diagnosis not present

## 2018-11-25 DIAGNOSIS — N3946 Mixed incontinence: Secondary | ICD-10-CM | POA: Diagnosis not present

## 2018-11-26 ENCOUNTER — Ambulatory Visit (INDEPENDENT_AMBULATORY_CARE_PROVIDER_SITE_OTHER): Payer: 59 | Admitting: Family Medicine

## 2018-11-26 ENCOUNTER — Encounter: Payer: Self-pay | Admitting: Family Medicine

## 2018-11-26 ENCOUNTER — Other Ambulatory Visit: Payer: Self-pay

## 2018-11-26 VITALS — BP 134/87 | HR 103 | Temp 97.6°F | Ht 63.0 in | Wt 240.3 lb

## 2018-11-26 DIAGNOSIS — J45909 Unspecified asthma, uncomplicated: Secondary | ICD-10-CM

## 2018-11-26 DIAGNOSIS — T7840XA Allergy, unspecified, initial encounter: Secondary | ICD-10-CM

## 2018-11-26 DIAGNOSIS — J3089 Other allergic rhinitis: Secondary | ICD-10-CM

## 2018-11-26 DIAGNOSIS — L509 Urticaria, unspecified: Secondary | ICD-10-CM

## 2018-11-26 DIAGNOSIS — E1121 Type 2 diabetes mellitus with diabetic nephropathy: Secondary | ICD-10-CM | POA: Diagnosis not present

## 2018-11-26 MED ORDER — PREDNISONE 20 MG PO TABS: ORAL_TABLET | ORAL | 0 refills | Status: DC

## 2018-11-26 MED ORDER — DEXAMETHASONE SODIUM PHOSPHATE 4 MG/ML IJ SOLN
4.0000 mg | Freq: Once | INTRAMUSCULAR | Status: AC
  Administered 2018-11-26: 4 mg via INTRAMUSCULAR

## 2018-11-26 MED ORDER — METHYLPREDNISOLONE ACETATE 40 MG/ML IJ SUSP
40.0000 mg | Freq: Once | INTRAMUSCULAR | Status: AC
  Administered 2018-11-26: 40 mg via INTRAMUSCULAR

## 2018-11-26 NOTE — Patient Instructions (Signed)
Please keep extra vigilant about your blood sugars as you are aware that prednisone and steroids will increase your sugars.  Please be very prudent with your diet and exercise to help compensate for this.  If blood sugars go really crazy, please give [place] a call as if need be, we can always start you on short acting insulin during this short period of time.

## 2018-11-26 NOTE — Progress Notes (Signed)
Pt here for an acute care OV today  Impression and Recommendations:    1. Hives   2. Allergic reaction, initial encounter   3. Reactive airway disease without complication, unspecified asthma severity, unspecified whether persistent   4. Type 2 diabetes mellitus with diabetic nephropathy, unspecified whether long term insulin use ([place])   5. Environmental and seasonal allergies     1. Hives - Allergic Reaction - Steroids prescribed today.  - Steroid injection provided today. - Prednisone taper provided to start tomorrow.  - Referral to allergist for allergy testing placed today.    Meds ordered this encounter  Medications  . predniSONE (DELTASONE) 20 MG tablet    Sig: Take 3 pills a day for 3 days, 2 pills a day for 3 days, 1 pill a day for 4 days then one half pill a day for 4 days then off    Dispense:  30 tablet    Refill:  0  . dexamethasone (DECADRON) injection 4 mg  . methylPREDNISolone acetate (DEPO-MEDROL) injection 40 mg     Orders Placed This Encounter  Procedures  . Allergy Panel 18, [employer]  . Allergy Panel 19, [employer]  . Allergy Panel, Animal Group  . Food Allergy Profile  . Ambulatory referral to Allergy     Education and routine counseling performed. Handouts provided  Gross side effects, risk and benefits, and alternatives of medications and treatment plan in general discussed with patient.  Patient is aware that all medications have potential side effects and we are unable to predict every side effect or drug-drug interaction that may occur.   Patient will call with any questions prior to using medication if they have concerns.    Expresses verbal understanding and consents to current therapy and treatment regimen.  No barriers to understanding were identified.  Red flag symptoms and signs discussed in detail.  Patient expressed understanding regarding what to do in case of emergency\urgent symptoms   Please see AVS handed out to  patient at the end of our visit for further patient instructions/ counseling done pertaining to today's office visit.   Return if symptoms worsen or fail to improve, for F-up of current med issues as previously d/c pt.     Note:  This document was prepared occasionally using Dragon voice recognition software and may include unintentional dictation errors in addition to a scribe.  This document serves as a record of services personally performed by Mellody Dance, DO. It was created on her behalf by [name], a trained medical scribe. The creation of this record is based on the scribe's personal observations and the provider's statements to them.   I have reviewed the above medical documentation for accuracy and completeness and I concur.  Mellody Dance, DO 11/26/2018 5:35 PM      ------------------------------------------------------------------------------------------------------------------------------------    Subjective:    CC:  Chief Complaint  Patient presents with  . Urticaria    HPI: [name] is a 58 y.o. female who presents to [place] [facility] today for issues as discussed below.  Notes she has hives everywhere today.  On Thursday night (six days ago), flew to [place] for a funeral.  Woke up Friday morning (five days ago), and her eye was swollen shut.  States she used a compress and benadryl, to limited relief.  Denies fever and chills, denies cough.  Notes she has lots of food and skin allergies; knows her triggers,  and tries to stay away from them.  She does not eat garlic, beef, pork, or chicken because they all make her sick.  States she is allergic to feathers, horses, and cats.    Has done allergy testing in the past, several years ago.  States she has had to use her inhaler recently due to her current allergic reaction symptoms.   Lab Results  Component Value Date   HGBA1C 7.1 (H) 10/17/2018   HGBA1C 6.8  (H) 07/08/2018   HGBA1C 7.1 (A) 03/17/2018    Problem  Reactive Airway Disease   Qualifier: Diagnosis of  By: Garen Grams     Environmental and Seasonal Allergies   Qualifier: Diagnosis of  By: Garen Grams        Wt Readings from Last 3 Encounters:  11/26/18 240 lb 4.8 oz (109 kg)  10/21/18 237 lb 8 oz (107.7 kg)  07/10/18 230 lb (104.3 kg)   BP Readings from Last 3 Encounters:  11/26/18 134/87  10/21/18 128/76  07/10/18 140/88   BMI Readings from Last 3 Encounters:  11/26/18 42.57 kg/m  10/21/18 42.07 kg/m  07/10/18 40.74 kg/m     Patient Care Team    Relationship Specialty Notifications Start End  Mellody Dance, DO PCP - General Family Medicine  07/07/18   Gatha Mayer, MD Consulting Physician Gastroenterology  07/07/18   Evaristo Bury, MD  Obstetrics and Gynecology  07/07/18    Comment: Uro-gynecologist specialist at [facility], [facility], [place] Referring Physician Optometry  07/07/18   Wallene Huh, [place] Consulting Physician Podiatry  07/07/18   Delrae Rend, MD Consulting Physician Endocrinology  07/07/18    Comment: For her diabetes     Patient Active Problem List   Diagnosis Date Noted  . Type 2 diabetes mellitus with diabetic nephropathy ([place]) 10/02/2017    Priority: High  . Hypertension associated with diabetes ([place]) 09/19/2007    Priority: High  . Hyperlipidemia associated with type 2 diabetes mellitus ([place]) 08/25/2007    Priority: High  . Morbid obesity ([place]) 07/07/2018    Priority: Medium  . Hepatic steatosis 05/08/2018    Priority: Medium  . Melanoma ([place]) 09/17/2013    Priority: Medium  . Anemia 05/26/2007    Priority: Medium  . 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 05/22/2007    Priority: Medium  . History of  ALCOHOL ABUSE 05/22/2007    Priority: Medium  . SEIZURE DISORDER 05/22/2007    Priority: Medium  . GERD (gastroesophageal reflux disease) 07/07/2018    Priority: Low  . Hypothyroidism  04/01/2013    Priority: Low  . INSOMNIA, CHRONIC 01/02/2010    Priority: Low  . 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[place]) 11/05/2007    Priority: Low  . Reactive airway disease 05/22/2007    Priority: Low  . Vitamin D insufficiency 07/10/2018  . Type 2 diabetes mellitus with stage 3 chronic kidney disease, without long-term current use of insulin ([place]) 07/10/2018  . Dysphagia 07/07/2018  . Hypercalcemia 07/07/2018  . Multinodular goiter 07/07/2018  . Fatty liver 07/07/2018  . Dyslipidemia 07/07/2018  . Essential hypertension 07/07/2018  . Insomnia 07/07/2018  . Chronic pelvic pain in female 07/07/2018  . Chronic bilateral low back pain without sciatica 07/07/2018  . Dyspareunia in female 07/07/2018  . 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 Hashimoto's disease 07/07/2018  . Myalgia 07/07/2018  . Sympathetically maintained pain 07/07/2018  . Status post hysterectomy with oophorectomy:  1998 or so;  non- cancerous reasons  07/07/2018  . AKI (acute kidney injury) ([place]) 05/08/2018  . Asthma without status asthmaticus 10/02/2017  . HLD (hyperlipidemia) 10/02/2017  . Anxiety disorder 10/02/2017  . Chest pain 10/22/2013  . Abnormal ECG 10/12/2013  . Dysphagia, unspecified(787.20) 04/01/2013  . Thyroid nodule 03/30/2013  . Nocturia 08/06/2011  . Pelvic pain 08/06/2011  . EDEMA 01/02/2010  . SYNCOPE 11/05/2007  . SIALADENITIS 11/03/2007  . ADJUSTMENT DISORDER WITH ANXIETY 10/27/2007  . Benign neoplasm of skin 09/19/2007  . OTHER ABNORMAL GLUCOSE 08/25/2007  . DEPRESSION 05/22/2007  . ALLERGIC RHINITIS 05/22/2007  . Environmental and seasonal allergies 05/22/2007      Past Medical History:  Diagnosis Date  . Allergic rhinitis   . Allergy   . Anxiety   . Asthma   . Depression   . Diabetes mellitus ([place])   . GERD (gastroesophageal reflux disease)   . Hepatic steatosis 05/08/2018  . Hx of echocardiogram    a. Echo (7/15): Mild focal basal hypertrophy of the septum, EF  55-60%, normal wall motion, grade 1 diastolic dysfunction  . Hyperlipidemia   . Hypertension   . Hypothyroid   . Melanoma ([place]) 2015   back  . Seizure disorder ([place])   . Seizures ([place])    seizures caused by antidepressants; not seizure in 12 years  . Sialadenitis    seen by Dr Lucia Gaskins in the past  . Sleep apnea    lost 40 pounds; does not use cpap     Past Surgical History:  Procedure Laterality Date  . ABDOMINAL HYSTERECTOMY  1998  . BILE DUCT EXPLORATION  1995  . C-section x3    . CESAREAN SECTION    . CHOLECYSTECTOMY  1995  . LAPAROSCOPIC LYSIS INTESTINAL ADHESIONS  2001, 2000  . LAPAROSCOPIC OOPHERECTOMY  1999  . MELANOMA EXCISION  2015   back   . TUBAL LIGATION  1998     Family History  Problem Relation Age of Onset  . Alcohol abuse Other   . Autism Daughter        and vitamin K deficiency  . Coronary artery disease Father        MI in his 66s  . Alcohol abuse Father   . Heart disease Father   . Diabetes Father   . Hyperlipidemia Father   . Hypertension Father   . Diabetes Other        multiple family members  . Depression Mother   . Obesity Sister        "sibling"  . Colon cancer Neg Hx      Social History   Socioeconomic History  . Marital status: Married    Spouse name: Not on file  . Number of children: 2  . Years of education: Not on file  . Highest education level: Not on file  Occupational History    Employer: [employer]  . Financial resource strain: Not on file  . Food insecurity:    Worry: Not on file    Inability: Not on file  . Transportation needs:    Medical: Not on file    Non-medical: Not on file  Tobacco Use  . Smoking status: Never Smoker  . Smokeless tobacco: Never Used  Substance and Sexual Activity  . Alcohol use: Yes    Alcohol/week: 0.0 standard drinks    Comment: Rarely  . Drug use: No  . Sexual activity: Not on file  Lifestyle  . Physical activity:    Days per  week: Not on file     Minutes per session: Not on file  . Stress: Not on file  Relationships  . Social connections:    Talks on phone: Not on file    Gets together: Not on file    Attends religious service: Not on file    Active member of club or organization: Not on file    Attends meetings of clubs or organizations: Not on file    Relationship status: Not on file  . Intimate partner violence:    Fear of current or ex partner: Not on file    Emotionally abused: Not on file    Physically abused: Not on file    Forced sexual activity: Not on file  Other Topics Concern  . Not on file  Social History Narrative   Occupation:  Designer, jewellery for [employer]   Married -  Lives with her husband and children    Never Smoked    Alcohol use-no              Current Meds  Medication Sig  . albuterol (PROVENTIL HFA;VENTOLIN HFA) 108 (90 Base) MCG/ACT inhaler Inhale 1 puff into the lungs every 6 (six) hours as needed for wheezing.  [place] Kitchen aspirin 81 MG tablet Take 81 mg by mouth daily.  Marland Kitchen atorvastatin (LIPITOR) 20 MG tablet Take 1 tablet (20 mg total) by mouth daily.  . Cholecalciferol (VITAMIN D) 2000 units CAPS 1 tab p.o. daily  . eszopiclone (LUNESTA) 2 MG TABS tablet Take 1 tablet (2 mg total) by mouth at bedtime as needed for sleep. Take immediately before bedtime  . fluticasone (FLOVENT HFA) 220 MCG/ACT inhaler Inhale 1 puff into the lungs 2 (two) times daily.  Mar[place] Kitchen glucose blood (ACCU-CHEK COMPACT PLUS) test strip Use to test BID - fasting in AM and 2 hours after largest meal  . levothyroxine (SYNTHROID, LEVOTHROID) 50 MCG tablet Take 1 tablet (50 mcg total) by mouth daily.  Marland Kitchen loratadine (CLARITIN) 10 MG tablet Take 10 mg by mouth daily.  . metFORMIN (GLUCOPHAGE) 1000 MG tablet Take 1,000 mg by mouth 2 (two) times daily.  . montelukast (SINGULAIR) 10 MG tablet Take 1 tablet (10 mg total) by mouth at bedtime.  Marland Kitchen MYRBETRIQ 50 MG TB24 tablet Take 1 tablet by mouth daily.  Marland Kitchen olmesartan (BENICAR) 5 MG tablet Take 1  tablet (5 mg total) by mouth daily.  Marland Kitchen omeprazole (PRILOSEC) 40 MG capsule Take 40 mg by mouth daily.  . sitaGLIPtin (JANUVIA) 100 MG tablet Take 1 tablet (100 mg total) by mouth daily.  Marland Kitchen venlafaxine XR (EFFEXOR-XR) 75 MG 24 hr capsule Take 1 capsule (75 mg total) by mouth daily.  . Vitamin D, Ergocalciferol, (DRISDOL) 1.25 MG (50000 UT) CAPS capsule Take 1 capsule (50,000 Units total) by mouth every 7 (seven) days.    Allergies:  Allergies  Allergen Reactions  . Dulaglutide Other (See Comments)    Abd pain/Dizziness   . Lexapro [Escitalopram] Other (See Comments)    Causes grand mal seizures; all antidepressants  . Morphine   . Morphine Sulfate Rash     Review of Systems: General:   Denies fever, chills, unexplained weight loss.  Optho/Auditory:   Denies visual changes, blurred vision/LOV Respiratory:   Denies wheeze, DOE more than baseline levels.   Cardiovascular:   Denies chest pain, palpitations, new onset peripheral edema  Gastrointestinal:   Denies nausea, vomiting, diarrhea, abd pain.  Genitourinary: Denies dysuria, freq/ urgency, flank pain or discharge from genitals.  Endocrine:     Denies hot or cold intolerance, polyuria, polydipsia. Musculoskeletal:   Denies unexplained myalgias, joint swelling, unexplained arthralgias, gait problems.  Skin:  Denies new onset rash, suspicious lesions Neurological:     Denies dizziness, unexplained weakness, numbness  Psychiatric/Behavioral:   Denies mood changes, suicidal or homicidal ideations, hallucinations    Objective:   Blood pressure 134/87, pulse (!) 103, temperature 97.6 F (36.4 C), height 5\' 3"  (1.6 m), weight 240 lb 4.8 oz (109 kg), SpO2 98 %. Body mass index is 42.57 kg/m. General:  Well Developed, well nourished, appropriate for stated age.  Neuro:  Alert and oriented,  extra-ocular muscles intact  HEENT:  Normocephalic, atraumatic, neck supple Skin:  Several large, raised erythematous lesions consistent with  hives. Skin otherwise warm, pink. Cardiac:  RRR, S1 S2 Respiratory:  ECTA B/L and A/P, Not using accessory muscles, speaking in full sentences- unlabored. Vascular:  Ext warm, no cyanosis apprec.; cap RF less 2 sec. Psych:  No HI/SI, judgement and insight good, Euthymic mood. Full Affect.

## 2018-11-29 LAB — FOOD ALLERGY PROFILE
Allergen Corn, IgE: 0.21 kU/L — AB
Clam IgE: <0.1 kU/L
Codfish IgE: <0.1 kU/L
Egg White IgE: 6.43 kU/L — AB
Milk IgE: 5.98 kU/L — AB
Peanut IgE: 0.21 kU/L — AB
Scallop IgE: <0.1 kU/L
Sesame Seed IgE: 0.24 kU/L — AB
Shrimp IgE: <0.1 kU/L
Soybean IgE: 0.34 kU/L — AB
Walnut IgE: 0.23 kU/L — AB
Wheat IgE: 0.47 kU/L — AB

## 2018-11-29 LAB — ALLERGY PANEL 19, SEAFOOD GROUP
Allergen Salmon IgE: <0.1 kU/L
Catfish: <0.1 kU/L
F023-IgE Crab: <0.1 kU/L
F080-IgE Lobster: <0.1 kU/L
Tuna: <0.1 kU/L

## 2018-11-29 LAB — ALLERGY PANEL 18, NUT MIX GROUP
Allergen Coconut IgE: <0.1 kU/L
F020-IGE ALMOND: 0.22 kU/L — AB
F202-IgE Cashew Nut: <0.1 kU/L
HAZELNUT (FILBERT) IGE: 0.29 kU/L — AB
Pecan Nut IgE: <0.1 kU/L

## 2018-11-29 LAB — ALLERGY PANEL, ANIMAL GROUP
Cow Dander IgE: 0.6 kU/L — AB
E085-IGE CHICKEN FEATHERS: 0.33 kU/L — AB
Goose Feathers IgE: 0.11 kU/L — AB
Horse dander: 8.68 kU/L — AB

## 2018-12-22 ENCOUNTER — Other Ambulatory Visit: Payer: Self-pay | Admitting: Family Medicine

## 2018-12-24 DIAGNOSIS — E119 Type 2 diabetes mellitus without complications: Secondary | ICD-10-CM | POA: Diagnosis not present

## 2018-12-24 DIAGNOSIS — E1159 Type 2 diabetes mellitus with other circulatory complications: Secondary | ICD-10-CM | POA: Diagnosis not present

## 2018-12-24 DIAGNOSIS — E559 Vitamin D deficiency, unspecified: Secondary | ICD-10-CM | POA: Diagnosis not present

## 2018-12-30 ENCOUNTER — Ambulatory Visit: Payer: 59 | Admitting: Allergy & Immunology

## 2018-12-30 ENCOUNTER — Other Ambulatory Visit: Payer: Self-pay

## 2018-12-30 ENCOUNTER — Encounter: Payer: Self-pay | Admitting: Allergy & Immunology

## 2018-12-30 VITALS — BP 140/94 | HR 89 | Temp 98.0°F | Resp 18 | Ht 63.75 in | Wt 235.8 lb

## 2018-12-30 DIAGNOSIS — T7800XD Anaphylactic reaction due to unspecified food, subsequent encounter: Secondary | ICD-10-CM | POA: Diagnosis not present

## 2018-12-30 DIAGNOSIS — J4530 Mild persistent asthma, uncomplicated: Secondary | ICD-10-CM | POA: Diagnosis not present

## 2018-12-30 DIAGNOSIS — J310 Chronic rhinitis: Secondary | ICD-10-CM | POA: Diagnosis not present

## 2018-12-30 MED ORDER — AUVI-Q 0.3 MG/0.3ML IJ SOAJ: INTRAMUSCULAR | 3 refills | Status: AC

## 2018-12-30 NOTE — Progress Notes (Signed)
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  She has a longstanding history of allergic rhinitis and she has gone out of her way to change her lifestyle to avoid any reactions.  She is on alternating antihistamines every 3 months as well as eyedrops.  She also has quite the schedule pin down when her daughter is exposed to horses, involving complicated schedule of washing close and avoiding contact.  She also seems to overuse allergy medications when she is traveling for work to avoid looking like she was "beat up".  Although this seems to be working well for her at this point, I think allergen immunotherapy would be an excellent addition to her treatment regimen.  We did provide information on this and we are going to get environmental allergy testing to look for other environmental triggers.  We will call her in 1 to 2 weeks with the results of these tests.  We also provided training for an epinephrine autoinjector and an anaphylaxis management plan.  Her reactions to egg and cows milk were certainly consistent with an IgE mediated reaction and I think it would provide some reassurance to both [place] and the patient to have epinephrine on hand.  Her asthma is under good control and we did not make any medication changes.  Plan/Recommendations:   1. Mild persistent asthma, uncomplicated - We did not do lung testing since this can spread the coronavirus. - It seems that your current medication regimen is working well. - We will not make changes at this time. - Daily controller medication(s): Singulair 10mg  daily and Flovent 140mcg 2 puffs twice daily with spacer - Prior to  physical activity: albuterol 2 puffs 10-15 minutes before physical activity. - Rescue medications: albuterol 4 puffs every 4-6 hours as needed - Changes during respiratory infections or worsening symptoms: Increase Flovent 156mcg to 4 puffs twice daily for TWO WEEKS. - Asthma control goals:  * Full participation in all desired activities (may need albuterol before activity) * Albuterol use two time or less a week on average (not counting use with activity) * Cough interfering with sleep two time or less a month * Oral steroids no more than once a year * No hospitalizations  2. 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 - Symptoms are certainly consistent with anaphylaxis. - Continue to avoid red meats as well, although this seems more like an intolerance. - Anaphylaxis management plan provided. - Training for [employer] provided. - They should call you in 1-2 days to confirm your shipping address.   3. Chronic rhinitis - We will get some more labs to look more into your environmental allergens.  - We will call you in 1-2 weeks with the results of the testing.  - Continue with: Zatidor one drop per eye twice daily as needed and Singulair (montelukast) 10mg  daily in conjunction with alternating antihistamines every three months. - Samples of Xyzal provided to add to the rotation.  - You can use an extra dose of the antihistamine, if needed, for breakthrough symptoms.  - Consider nasal saline rinses 1-2 times daily to remove allergens from the nasal cavities as well  as help with mucous clearance (this is especially helpful to do before the nasal sprays are given) - Consider allergy shots as a means of long-term control. - Allergy shots "re-train" and "reset" the immune system to ignore environmental allergens and decrease the resulting immune response to those allergens (sneezing, itchy watery eyes, runny nose, nasal congestion, etc).     - Allergy shots improve symptoms in 75-85% of patients.  - Information on allergy shots provided.  4. Return in about 3 months (around 03/31/2019). This can be an in-person, a virtual Webex or a telephone follow up visit.   Subjective:   [name] is a 58 y.o. female presenting today for evaluation of  Chief Complaint  Patient presents with   Allergic Reaction   Angioedema    [name] has a history of the following: Patient Active Problem List   Diagnosis Date Noted   Vitamin D insufficiency 07/10/2018   Type 2 diabetes mellitus with stage 3 chronic kidney disease, without long-term current use of insulin ([place]) 07/10/2018   GERD (gastroesophageal reflux disease) 07/07/2018   Dysphagia 07/07/2018   Hypercalcemia 07/07/2018   Multinodular goiter 07/07/2018   Fatty liver 07/07/2018   Dyslipidemia 07/07/2018   Essential hypertension 07/07/2018   Morbid obesity ([place]) 07/07/2018   Insomnia 07/07/2018   Chronic pelvic pain in female 07/07/2018   Chronic bilateral low back pain without sciatica 07/07/2018   Dyspareunia in female 07/07/2018   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[place]) 07/07/2018   Hashimoto's disease 07/07/2018   Myalgia 07/07/2018   Sympathetically maintained pain 07/07/2018   Status post hysterectomy with oophorectomy:  1998 or so;  non- cancerous reasons 07/07/2018   AKI (acute kidney injury) ([place]) 05/08/2018   Hepatic steatosis 05/08/2018   Type 2 diabetes mellitus with diabetic nephropathy ([place]) 10/02/2017   Asthma without status asthmaticus 10/02/2017   HLD (hyperlipidemia) 10/02/2017   Anxiety disorder 10/02/2017   Chest pain 10/22/2013   Abnormal ECG 10/12/2013   Melanoma ([place]) 09/17/2013   Dysphagia, unspecified(787.20) 04/01/2013   Hypothyroidism 04/01/2013   Thyroid nodule 03/30/2013   Nocturia 08/06/2011   Pelvic pain 08/06/2011   INSOMNIA, CHRONIC 01/02/2010   EDEMA 01/02/2010   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/05/2007   SYNCOPE 11/05/2007   SIALADENITIS 11/03/2007   ADJUSTMENT DISORDER WITH ANXIETY 10/27/2007   Benign neoplasm of skin 09/19/2007   Hypertension associated with diabetes ([place]) 09/19/2007   Hyperlipidemia associated with type 2 diabetes mellitus ([place]) 08/25/2007   OTHER ABNORMAL GLUCOSE 08/25/2007   Anemia 05/26/2007   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  [name] was referred by Claudine Mouton is a 58 y.o. female presenting for an evaluation of allergic reactions.  However, during the discussion it seems that she also has multiple other atopic complaints.  The episode that brings her in today occurred in the middle of March.  She ate 2 eggs and toast and then within minutes developed tongue swelling, head to toe hives, and some trouble breathing.  She went to see her primary care provider who gave her an injection of steroid.  She was not given an EpiPen at that time.  Prior to that, she was eating  eggs on a regular basis.  She had a very large lab work performed that showed elevated IgE to egg (6.43) and milk (5.98).  She also had very low IgE to peanut, soy, walnut, wheat, corn, sesame, hazelnut, and almond.  She is only avoiding egg and milk at this time.  She has maintained a vegan diet since this reaction in March and she has done very well.  Of note, she has been avoiding meat since 2012 when she noticed that she was developing constipation when she had red meat.   Asthma/Respiratory Symptom History: She has a longstanding history of asthma.  Currently she is on Flovent 110 mcg 2 puffs twice daily.  She is also on Singulair 10 mg daily with albuterol as  needed.  She does not need prednisone very often at all.  She estimates she has prednisone every 2 to 3 years at the very most for her breathing.  She denies any nighttime symptoms.  She has never been hospitalized or intubated for her asthma.  Triggers include environmental allergens, specifically horse dander.  Allergic Rhinitis Symptom History: She does have a longstanding history of allergic rhinitis.  As a child, she was allergic to dust mites and received allergy shots for several years during middle school and high school.  These did provide some relief.  She continues to have allergic rhinitis symptoms.  She is on antihistamines daily.  She alternates every 3 months between Claritin, Zyrtec, and Allegra.  She has never tried Xyzal.  She does not use no sprays because they cause a burning sensation.  She does endorse allergic conjunctivitis which she treats with Zaditor.  She overuses it when she is around horses.  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  The patient reports that when she went to see her daughter compete, she would have to use eyedrops consistently during the competition.  Her daughter continues with therapeutic horseback riding regularly and will strip in the laundry room when she gets in the house and throw her close directly into the washer.  Then her daughter goes to take a shower so that she can interact with her mother.  The patient does a lot of travel for work and when she is sleeping at hotels she will wake up with swollen eyes.  She also uses her eyedrops quite a bit during the travel so that she does not look like she was "beat up".  She does not get antibiotics routinely for sinus infections.  She has not been allergy tested since she was a child.  As part of this recent work-up, she did have an animal dander panel sent which showed  sensitization to horses, cows, goose, and chicken feathers.  She does report sensitizations to certain exposures, including dyes and fragrances in 1 detergents.  She also has some problems with cosmetics occasionally.  Otherwise, there is no history of other atopic diseases, including drug allergies, stinging insect allergies, eczema, urticaria or contact dermatitis. There is no significant infectious history. Vaccinations are up to date.    Past Medical History: Patient Active Problem List   Diagnosis Date Noted   Vitamin D insufficiency 07/10/2018   Type 2 diabetes mellitus with stage 3 chronic kidney disease, without long-term current use of insulin ([place]) 07/10/2018   GERD (gastroesophageal reflux disease) 07/07/2018   Dysphagia 07/07/2018   Hypercalcemia 07/07/2018  Multinodular goiter 07/07/2018   Fatty liver 07/07/2018   Dyslipidemia 07/07/2018   Essential hypertension 07/07/2018   Morbid obesity ([place]) 07/07/2018   Insomnia 07/07/2018   Chronic pelvic pain in female 07/07/2018   Chronic bilateral low back pain without sciatica 07/07/2018   Dyspareunia in female 07/07/2018   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) 07/07/2018   Hashimoto's disease 07/07/2018   Myalgia 07/07/2018   Sympathetically maintained pain 07/07/2018   Status post hysterectomy with oophorectomy:  1998 or so;  non- cancerous reasons 07/07/2018   AKI (acute kidney injury) ([place]) 05/08/2018   Hepatic steatosis 05/08/2018   Type 2 diabetes mellitus with diabetic nephropathy ([place]) 10/02/2017   Asthma without status asthmaticus 10/02/2017   HLD (hyperlipidemia) 10/02/2017   Anxiety disorder 10/02/2017   Chest pain 10/22/2013   Abnormal ECG 10/12/2013   Melanoma ([place]) 09/17/2013   Dysphagia, unspecified(787.20) 04/01/2013   Hypothyroidism 04/01/2013   Thyroid nodule 03/30/2013   Nocturia 08/06/2011   Pelvic pain 08/06/2011   INSOMNIA, CHRONIC 01/02/2010   EDEMA 01/02/2010    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[place]) 11/05/2007   SYNCOPE 11/05/2007   SIALADENITIS 11/03/2007   ADJUSTMENT DISORDER WITH ANXIETY 10/27/2007   Benign neoplasm of skin 09/19/2007   Hypertension associated with diabetes ([place]) 09/19/2007   Hyperlipidemia associated with type 2 diabetes mellitus ([place]) 08/25/2007   OTHER ABNORMAL GLUCOSE 08/25/2007   Anemia 05/26/2007   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 05/22/2007   History of  ALCOHOL ABUSE 05/22/2007   DEPRESSION 05/22/2007   ALLERGIC RHINITIS 05/22/2007   Reactive airway disease 05/22/2007   SEIZURE DISORDER 05/22/2007   Environmental and seasonal allergies 05/22/2007    Medication List:  Allergies as of 12/30/2018      Reactions   Lac Bovis Hives, Swelling   Dulaglutide Other (See Comments)   Abd pain/Dizziness   Eggs Or Egg-derived Products Hives, Swelling   Lexapro [escitalopram] Other (See Comments)   Causes grand mal seizures; all antidepressants   Morphine    Morphine Sulfate Rash      Medication List       Accurate as of December 30, 2018  9:57 AM. Always use your most recent med list.        albuterol 108 (90 Base) MCG/ACT inhaler Commonly known as:  PROVENTIL HFA;VENTOLIN HFA Inhale 1 puff into the lungs every 6 (six) hours as needed for wheezing.   aspirin 81 MG tablet Take 81 mg by mouth daily.   atorvastatin 20 MG tablet Commonly known as:  LIPITOR Take 1 tablet (20 mg total) by mouth daily.   eszopiclone 2 MG Tabs tablet Commonly known as:  LUNESTA Take 1 tablet (2 mg total) by mouth at bedtime as needed for sleep. Take immediately before bedtime   fluticasone 220 MCG/ACT inhaler Commonly known as:  Flovent HFA Inhale 1 puff into the lungs 2 (two) times daily.   glucose blood test strip Commonly known as:  Accu-Chek Compact Plus Use to test BID - fasting in AM and 2 hours after largest meal   levothyroxine 50 MCG tablet Commonly known as:  SYNTHROID, LEVOTHROID Take  1 tablet (50 mcg total) by mouth daily.   loratadine 10 MG tablet Commonly known as:  CLARITIN Take 10 mg by mouth daily.   metFORMIN 1000 MG tablet Commonly known as:  GLUCOPHAGE Take 1,000 mg by mouth 2 (two) times daily.   montelukast 10 MG tablet Commonly known as:  SINGULAIR TAKE 1 TABLET BY MOUTH AT  BEDTIME  Myrbetriq 50 MG Tb24 tablet Generic drug:  mirabegron ER Take 1 tablet by mouth daily.   olmesartan 5 MG tablet Commonly known as:  BENICAR Take 1 tablet (5 mg total) by mouth daily.   omeprazole 40 MG capsule Commonly known as:  PRILOSEC Take 40 mg by mouth daily.   predniSONE 20 MG tablet Commonly known as:  DELTASONE Take 3 pills a day for 3 days, 2 pills a day for 3 days, 1 pill a day for 4 days then one half pill a day for 4 days then off   sitaGLIPtin 100 MG tablet Commonly known as:  JANUVIA Take 1 tablet (100 mg total) by mouth daily.   venlafaxine XR 75 MG 24 hr capsule Commonly known as:  EFFEXOR-XR Take 1 capsule (75 mg total) by mouth daily.   Vitamin D (Ergocalciferol) 1.25 MG (50000 UT) Caps capsule Commonly known as:  DRISDOL Take 1 capsule (50,000 Units total) by mouth every 7 (seven) days.   Vitamin D 2 MCG (2000 UT) Caps 1 tab p.o. daily       Birth History: non-contributory  Developmental History: non-contributory  Past Surgical History: Past Surgical History:  Procedure Laterality Date   ABDOMINAL HYSTERECTOMY  1998   BILE DUCT EXPLORATION  1995   C-section x3     [place] INTESTINAL ADHESIONS  2001, 2000   LAPAROSCOPIC OOPHERECTOMY  1999   MELANOMA EXCISION  2015   back    TUBAL LIGATION  1998     Family History: Family History  Problem Relation Age of Onset   Alcohol abuse Other    Autism Daughter        and vitamin K deficiency   Coronary artery disease Father        MI in his 2s   Alcohol abuse Father    Heart disease Father     Diabetes Father    Hyperlipidemia Father    Hypertension Father    Diabetes Other        multiple family members   Depression Mother    Obesity Sister        "sibling"   Colon cancer Neg Hx      Social History: Lind[place] lives at home with her family.  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  Her husband is an Archivist. He works on TEFL teacher.  They live in a house that was built in the 1980s.  There is hardwood in the main living area and rugs in the bedrooms.  They have electric heating and central cooling.  There are 2 dogs inside the home.  She does have dust mite covers on her bed and pillows.  There is no tobacco exposure.  She does travel a lot for her work, prior to the coronavirus pandemic.  He spends about 50% of her time on the road.   Review of Systems  Constitutional: Negative.  Negative for fever, malaise/fatigue and weight loss.  HENT: Positive for congestion, sinus pain and sore throat. Negative for ear discharge and ear pain.        Positive for postnasal drip.  Eyes: Positive for discharge and redness. Negative for pain.  Respiratory: Negative for cough, sputum production, shortness of breath and wheezing.   Cardiovascular: Negative.  Negative for chest pain and palpitations.  Gastrointestinal: Positive for nausea and vomiting. Negative for abdominal pain and heartburn.  Skin: Positive for rash. Negative for itching.  Neurological: Negative for dizziness and headaches.  Endo/Heme/Allergies: Negative for environmental allergies. Does not bruise/bleed easily.       Objective:   Blood pressure (!) 140/94, pulse 89, temperature 98 F (36.7 C), temperature source Oral, resp. rate 18, height 5' 3.75" (1.619 m), weight 235 lb 12.8 oz (107 kg), SpO2 95 %. Body mass index is 40.79 kg/m.   Physical Exam:   Physical Exam  Constitutional: She appears well-developed.  HENT:  Head: Normocephalic  and atraumatic.  Right Ear: Tympanic membrane, external ear and ear canal normal. No drainage, swelling or tenderness. Tympanic membrane is not injected, not scarred, not erythematous, not retracted and not bulging.  Left Ear: Tympanic membrane, external ear and ear canal normal. No drainage, swelling or tenderness. Tympanic membrane is not injected, not scarred, not erythematous, not retracted and not bulging.  Nose: Mucosal edema and rhinorrhea present. No nasal deformity or septal deviation. No epistaxis. Right sinus exhibits no maxillary sinus tenderness and no frontal sinus tenderness. Left sinus exhibits no maxillary sinus tenderness and no frontal sinus tenderness.  Mouth/Throat: Uvula is midline and oropharynx is clear and moist. Mucous membranes are not pale and not dry.  There is mild cobblestoning in the posterior oropharynx.  Tonsils are 2+ bilaterally without exudates.  Eyes: Pupils are equal, round, and reactive to light. Conjunctivae and EOM are normal. Right eye exhibits no chemosis and no discharge. Left eye exhibits no chemosis and no discharge. Right conjunctiva is not injected. Left conjunctiva is not injected.  Cardiovascular: Normal rate, regular rhythm and normal heart sounds.  Respiratory: Effort normal and breath sounds normal. No accessory muscle usage. No tachypnea. No respiratory distress. She has no wheezes. She has no rhonchi. She has no rales. She exhibits no tenderness.  Moving air well in all lung fields.  No increased work of breathing.  GI: There is no abdominal tenderness. There is no rebound and no guarding.  Lymphadenopathy:       Head (right side): No submandibular, no tonsillar and no occipital adenopathy present.       Head (left side): No submandibular, no tonsillar and no occipital adenopathy present.    She has no cervical adenopathy.  Neurological: She is alert.  Skin: No abrasion, no petechiae and no rash noted. Rash is not papular, not vesicular and not  urticarial. No erythema. No pallor.  Sensitive skin.  No dermatographia some.  Psychiatric: She has a normal mood and affect.     Diagnostic studies: deferred due to recent antihistamine use (labs sent instead)         Salvatore Marvel, MD [facility] [place] [facility]

## 2018-12-30 NOTE — Patient Instructions (Addendum)
1. Mild persistent asthma, uncomplicated - We did not do lung testing since this can spread the coronavirus. - It seems that your current medication regimen is working well. - We will not make changes at this time. - Daily controller medication(s): Singulair 10mg  daily and Flovent 143mcg 2 puffs twice daily with spacer - Prior to physical activity: albuterol 2 puffs 10-15 minutes before physical activity. - Rescue medications: albuterol 4 puffs every 4-6 hours as needed - Changes during respiratory infections or worsening symptoms: Increase Flovent 141mcg to 4 puffs twice daily for TWO WEEKS. - Asthma control goals:  * Full participation in all desired activities (may need albuterol before activity) * Albuterol use two time or less a week on average (not counting use with activity) * Cough interfering with sleep two time or less a month * Oral steroids no more than once a year * No hospitalizations  2. 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 - Symptoms are certainly consistent with anaphylaxis. - Continue to avoid red meats as well, although this seems more like an intolerance. - Anaphylaxis management plan provided. - Training for [employer] provided. - They should call you in 1-2 days to confirm your shipping address.   3. Chronic rhinitis - We will get some more labs to look more into your environmental allergens.  - We will call you in 1-2 weeks with the results of the testing.  - Continue with: Zatidor one drop per eye twice daily as needed and Singulair (montelukast) 10mg  daily in conjunction with alternating antihistamines every three months. - Samples of Xyzal provided to add to the rotation.  - You can use an extra dose of the antihistamine, if needed, for breakthrough symptoms.  - Consider nasal saline rinses 1-2 times daily to remove allergens from the nasal cavities as well as help with mucous clearance (this is  especially helpful to do before the nasal sprays are given) - Consider allergy shots as a means of long-term control. - Allergy shots "re-train" and "reset" the immune system to ignore environmental allergens and decrease the resulting immune response to those allergens (sneezing, itchy watery eyes, runny nose, nasal congestion, etc).    - Allergy shots improve symptoms in 75-85% of patients.  - Information on allergy shots provided.  4. Return in about 3 months (around 03/31/2019). This can be an in-person, a virtual Webex or a telephone follow up visit.   Please inform us of any Emergency Department visits, hospitalizations, or changes in symptoms. Call us before going to the ED for breathing or allergy symptoms since we might be able to fit you in for a sick visit. Feel free to contact us anytime with any questions, problems, or concerns.  It was a pleasure to meet you today!  Websites that have reliable patient information: 1. American Academy of Asthma, Allergy, and Immunology: www.aaaai.org 2. Food Allergy Research and Education (FARE): foodallergy.org 3. Mothers of Asthmatics: http://www.asthmacommunitynetwork.org 4. American College of Allergy, Asthma, and Immunology: www.acaai.org  "Like" [place] on Facebook and Instagram for our latest updates!      Make sure you are registered to vote! If you have moved or changed any of your contact information, you will need to get this updated before voting!    Voter ID laws are NOT going into effect for the General Election in November 2020! DO NOT let this stop you from exercising your right to vote!  Allergy Shots   Allergies are the result of a chain reaction that starts in the immune system. Your immune system controls how your body defends itself. For instance, if you have an allergy to pollen, your immune system identifies pollen as an invader or allergen. Your immune system overreacts by producing antibodies called Immunoglobulin E  (IgE). These antibodies travel to cells that release chemicals, causing an allergic reaction.  The concept behind allergy immunotherapy, whether it is received in the form of shots or tablets, is that the immune system can be desensitized to specific allergens that trigger allergy symptoms. Although it requires time and patience, the payback can be long-term relief.  How Do Allergy Shots Work?  Allergy shots work much like a vaccine. Your body responds to injected amounts of a particular allergen given in increasing doses, eventually developing a resistance and tolerance to it. Allergy shots can lead to decreased, minimal or no allergy symptoms.  There generally are two phases: build-up and maintenance. Build-up often ranges from three to six months and involves receiving injections with increasing amounts of the allergens. The shots are typically given once or twice a week, though more rapid build-up schedules are sometimes used.  The maintenance phase begins when the most effective dose is reached. This dose is different for each person, depending on how allergic you are and your response to the build-up injections. Once the maintenance dose is reached, there are longer periods between injections, typically two to four weeks.  Occasionally doctors give cortisone-type shots that can temporarily reduce allergy symptoms. These types of shots are different and should not be confused with allergy immunotherapy shots.  Who Can Be Treated with Allergy Shots?  Allergy shots may be a good treatment approach for people with allergic rhinitis (hay fever), allergic asthma, conjunctivitis (eye allergy) or stinging insect allergy.   Before deciding to begin allergy shots, you should consider:  . The length of allergy season and the severity of your symptoms . Whether medications and/or changes to your environment can control your symptoms . Your desire to avoid long-term medication use . Time: allergy  immunotherapy requires a major time commitment . Cost: may vary depending on your insurance coverage  Allergy shots for children age 63 and older are effective and often well tolerated. They might prevent the onset of new allergen sensitivities or the progression to asthma.  Allergy shots are not started on patients who are pregnant but can be continued on patients who become pregnant while receiving them. In some patients with other medical conditions or who take certain common medications, allergy shots may be of risk. It is important to mention other medications you talk to your allergist.   When Will I Feel Better?  Some may experience decreased allergy symptoms during the build-up phase. For others, it may take as long as 12 months on the maintenance dose. If there is no improvement after a year of maintenance, your allergist will discuss other treatment options with you.  If you aren't responding to allergy shots, it may be because there is not enough dose of the allergen in your vaccine or there are missing allergens that were not identified during your allergy testing. Other reasons could be that there are high levels of the allergen in your environment or major exposure to non-allergic triggers like tobacco smoke.  What Is the Length of Treatment?  Once the maintenance dose is reached, allergy shots are generally continued for three to five years. The decision to stop  should be discussed with your allergist at that time. Some people may experience a permanent reduction of allergy symptoms. Others may relapse and a longer course of allergy shots can be considered.  What Are the Possible Reactions?  The two types of adverse reactions that can occur with allergy shots are local and systemic. Common local reactions include very mild redness and swelling at the injection site, which can happen immediately or several hours after. A systemic reaction, which is less common, affects the entire  body or a particular body system. They are usually mild and typically respond quickly to medications. Signs include increased allergy symptoms such as sneezing, a stuffy nose or hives.  Rarely, a serious systemic reaction called anaphylaxis can develop. Symptoms include swelling in the throat, wheezing, a feeling of tightness in the chest, nausea or dizziness. Most serious systemic reactions develop within 30 minutes of allergy shots. This is why it is strongly recommended you wait in your doctor's office for 30 minutes after your injections. Your allergist is trained to watch for reactions, and his or her staff is trained and equipped with the proper medications to identify and treat them.  Who Should Administer Allergy Shots?  The preferred location for receiving shots is your prescribing allergist's office. Injections can sometimes be given at another facility where the physician and staff are trained to recognize and treat reactions, and have received instructions by your prescribing allergist.

## 2019-01-01 LAB — IGE+ALLERGENS ZONE 2(30)
Alternaria Alternata IgE: <0.1 kU/L
Amer Sycamore IgE Qn: 0.18 kU/L — AB
Aspergillus Fumigatus IgE: <0.1 kU/L
Bahia Grass IgE: 0.1 kU/L — AB
Bermuda Grass IgE: 0.11 kU/L — AB
Cat Dander IgE: 0.41 kU/L — AB
Cedar, Mountain IgE: 0.12 kU/L — AB
Cladosporium Herbarum IgE: <0.1 kU/L
Cockroach, American IgE: <0.1 kU/L
Common Silver Birch IgE: <0.1 kU/L
D Farinae IgE: <0.1 kU/L
D Pteronyssinus IgE: <0.1 kU/L
Dog Dander IgE: 3.51 kU/L — AB
Elm, American IgE: 0.16 kU/L — AB
Hickory, White IgE: <0.1 kU/L
IgE (Immunoglobulin E), Serum: 445 IU/mL (ref 6–495)
Johnson Grass IgE: 0.13 kU/L — AB
Maple/Box Elder IgE: 0.11 kU/L — AB
Mucor Racemosus IgE: <0.1 kU/L
Mugwort IgE Qn: <0.1 kU/L
Nettle IgE: <0.1 kU/L
Oak, White IgE: 0.11 kU/L — AB
Penicillium Chrysogen IgE: <0.1 kU/L
Pigweed, Rough IgE: <0.1 kU/L
Plantain, English IgE: 0.16 kU/L — AB
Ragweed, Short IgE: 0.12 kU/L — AB
Sheep Sorrel IgE Qn: 0.13 kU/L — AB
Stemphylium Herbarum IgE: <0.1 kU/L
Sweet gum IgE RAST Ql: <0.1 kU/L
Timothy Grass IgE: 0.1 kU/L — AB
White Mulberry IgE: <0.1 kU/L

## 2019-01-01 LAB — ALLERGEN PROFILE, MOLD
Aureobasidi Pullulans IgE: <0.1 kU/L
Candida Albicans IgE: <0.1 kU/L
M009-IgE Fusarium proliferatum: <0.1 kU/L
M014-IgE Epicoccum purpur: <0.1 kU/L
Phoma Betae IgE: <0.1 kU/L
Setomelanomma Rostrat: <0.1 kU/L

## 2019-01-01 LAB — TRYPTASE: Tryptase: 10 ug/L (ref 2.2–13.2)

## 2019-01-10 ENCOUNTER — Encounter: Payer: Self-pay | Admitting: Allergy & Immunology

## 2019-01-10 ENCOUNTER — Other Ambulatory Visit: Payer: Self-pay | Admitting: Family Medicine

## 2019-01-10 DIAGNOSIS — E1121 Type 2 diabetes mellitus with diabetic nephropathy: Secondary | ICD-10-CM

## 2019-01-13 MED ORDER — LEVOCETIRIZINE DIHYDROCHLORIDE 5 MG PO TABS: 5.0000 mg | ORAL_TABLET | Freq: Every evening | ORAL | 5 refills | Status: DC

## 2019-01-13 NOTE — Addendum Note (Signed)
Addended by: Valentina Shaggy on: 01/13/2019 06:24 AM   Modules accepted: Orders

## 2019-01-13 NOTE — Addendum Note (Signed)
Addended by: Valentina Shaggy on: 01/13/2019 06:29 AM   Modules accepted: Orders

## 2019-01-17 ENCOUNTER — Other Ambulatory Visit: Payer: Self-pay | Admitting: Family Medicine

## 2019-01-18 ENCOUNTER — Other Ambulatory Visit: Payer: Self-pay | Admitting: Family Medicine

## 2019-01-18 DIAGNOSIS — G4700 Insomnia, unspecified: Secondary | ICD-10-CM

## 2019-01-21 ENCOUNTER — Ambulatory Visit (INDEPENDENT_AMBULATORY_CARE_PROVIDER_SITE_OTHER): Payer: 59 | Admitting: Family Medicine

## 2019-01-21 ENCOUNTER — Other Ambulatory Visit: Payer: Self-pay

## 2019-01-21 ENCOUNTER — Encounter: Payer: Self-pay | Admitting: Family Medicine

## 2019-01-21 VITALS — BP 150/90 | HR 85 | Temp 98.5°F | Ht 63.75 in | Wt 233.0 lb

## 2019-01-21 DIAGNOSIS — F39 Unspecified mood [affective] disorder: Secondary | ICD-10-CM

## 2019-01-21 DIAGNOSIS — E1121 Type 2 diabetes mellitus with diabetic nephropathy: Secondary | ICD-10-CM

## 2019-01-21 DIAGNOSIS — E1159 Type 2 diabetes mellitus with other circulatory complications: Secondary | ICD-10-CM | POA: Diagnosis not present

## 2019-01-21 DIAGNOSIS — R569 Unspecified convulsions: Secondary | ICD-10-CM

## 2019-01-21 DIAGNOSIS — F4321 Adjustment disorder with depressed mood: Secondary | ICD-10-CM

## 2019-01-21 DIAGNOSIS — I10 Essential (primary) hypertension: Secondary | ICD-10-CM

## 2019-01-21 DIAGNOSIS — I152 Hypertension secondary to endocrine disorders: Secondary | ICD-10-CM
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
## 2019-01-26 ENCOUNTER — Telehealth: Payer: Self-pay

## 2019-01-26 NOTE — Telephone Encounter (Signed)
Called patient and left her a message to call office. We received her allergy injection consent in the mail however she did not sign the injection estimate form. Patient did leave a sticky note on it stating that she would like to start in June and wanted [place] to send her an estimate as to how much it would cost her. We will need to inform patient that she should call her insurance company with the codes. She will be receiving one injection. Codes that she will need to give her insurance are CPT [phone] for one allergy vial set and CPT [id number] for the administration of the one injection. When patient does start she will need to sign the estimate form and make her appt.

## 2019-01-30 NOTE — Telephone Encounter (Signed)
Called patient and explained that patient will need to call insurance company to receive estimate for her allergy injections and vials. Gave patient the needed CPT codes. Advised to patient that if she is happy with the estimate than to give our office a call so she can be placed on the schedule 2 weeks out to have her vials made and to start injections. Patient verbalized understanding. Patient's consent and allergy injection estimate has been placed in green accordion folder in shot room.

## 2019-02-01 ENCOUNTER — Encounter: Payer: Self-pay | Admitting: Family Medicine

## 2019-02-03 ENCOUNTER — Other Ambulatory Visit: Payer: Self-pay

## 2019-02-03 DIAGNOSIS — E039 Hypothyroidism, unspecified: Secondary | ICD-10-CM

## 2019-02-03 MED ORDER — METFORMIN HCL 1000 MG PO TABS: 1000.0000 mg | ORAL_TABLET | Freq: Two times a day (BID) | ORAL | 0 refills | Status: DC

## 2019-02-03 MED ORDER — LEVOTHYROXINE SODIUM 50 MCG PO TABS: 50.0000 ug | ORAL_TABLET | Freq: Every day | ORAL | 1 refills | Status: DC

## 2019-02-03 NOTE — Telephone Encounter (Signed)
Pharmacy sent refill request for synthroid, reviewed chart and sent in refills per office policy. MPulliam, CMA/RT(R)

## 2019-02-03 NOTE — Telephone Encounter (Signed)
Pharmacy sent refill request for metformin.  Reviewed chart medication was last filled by a pervious provider.  LOV 01/21/2019.  Please review and advise. MPulliam, CMA/RT(R)

## 2019-03-08 ENCOUNTER — Other Ambulatory Visit: Payer: Self-pay | Admitting: Family Medicine

## 2019-03-08 DIAGNOSIS — E785 Hyperlipidemia, unspecified: Secondary | ICD-10-CM

## 2019-03-08 DIAGNOSIS — E1169 Type 2 diabetes mellitus with other specified complication: Secondary | ICD-10-CM

## 2019-03-08 DIAGNOSIS — F39 Unspecified mood [affective] disorder: Secondary | ICD-10-CM

## 2019-03-14 ENCOUNTER — Other Ambulatory Visit: Payer: Self-pay | Admitting: Family Medicine

## 2019-03-23 ENCOUNTER — Other Ambulatory Visit: Payer: Self-pay | Admitting: Family Medicine

## 2019-03-23 DIAGNOSIS — E1121 Type 2 diabetes mellitus with diabetic nephropathy: Secondary | ICD-10-CM

## 2019-03-23 DIAGNOSIS — G4700 Insomnia, unspecified: Secondary | ICD-10-CM

## 2019-03-26 ENCOUNTER — Encounter: Payer: Self-pay | Admitting: Family Medicine

## 2019-03-30 ENCOUNTER — Other Ambulatory Visit: Payer: Self-pay

## 2019-03-30 DIAGNOSIS — E1169 Type 2 diabetes mellitus with other specified complication: Secondary | ICD-10-CM

## 2019-03-30 DIAGNOSIS — E1159 Type 2 diabetes mellitus with other circulatory complications: Secondary | ICD-10-CM

## 2019-03-30 DIAGNOSIS — E1122 Type 2 diabetes mellitus with diabetic chronic kidney disease: Secondary | ICD-10-CM

## 2019-03-30 DIAGNOSIS — I152 Hypertension secondary to endocrine disorders: Secondary | ICD-10-CM

## 2019-03-30 MED ORDER — OLMESARTAN MEDOXOMIL 5 MG PO TABS: 5.0000 mg | ORAL_TABLET | Freq: Every day | ORAL | 1 refills | Status: DC

## 2019-03-30 MED ORDER — ATORVASTATIN CALCIUM 20 MG PO TABS: 20.0000 mg | ORAL_TABLET | Freq: Every day | ORAL | 1 refills | Status: AC

## 2019-03-30 NOTE — Progress Notes (Signed)
Pharmacy sent refill request for Olmesartan and Atrovastatin.  Reviewed chart and sent in refills per office policy. MPulliam, CMA/RT(R)

## 2019-03-31 ENCOUNTER — Other Ambulatory Visit: Payer: Self-pay

## 2019-03-31 ENCOUNTER — Ambulatory Visit: Payer: 59 | Admitting: Allergy & Immunology

## 2019-03-31 ENCOUNTER — Encounter: Payer: Self-pay | Admitting: Allergy & Immunology

## 2019-03-31 VITALS — BP 152/98 | HR 88 | Temp 97.9°F | Resp 16 | Ht 63.5 in | Wt 230.2 lb

## 2019-03-31 DIAGNOSIS — H6123 Impacted cerumen, bilateral: Secondary | ICD-10-CM | POA: Diagnosis not present

## 2019-03-31 DIAGNOSIS — T7800XD Anaphylactic reaction due to unspecified food, subsequent encounter: Secondary | ICD-10-CM | POA: Diagnosis not present

## 2019-03-31 DIAGNOSIS — J3089 Other allergic rhinitis: Secondary | ICD-10-CM

## 2019-03-31 DIAGNOSIS — J302 Other seasonal allergic rhinitis: Secondary | ICD-10-CM

## 2019-03-31 DIAGNOSIS — J4530 Mild persistent asthma, uncomplicated: Secondary | ICD-10-CM | POA: Diagnosis not present

## 2019-03-31 MED ORDER — LEVOCETIRIZINE DIHYDROCHLORIDE 5 MG PO TABS: 5.0000 mg | ORAL_TABLET | Freq: Every evening | ORAL | 5 refills | Status: DC

## 2019-03-31 NOTE — Patient Instructions (Addendum)
1. Mild persistent asthma, uncomplicated - We did not do lung testing today. - It seems that the current medication regimen is working well.  - Daily controller medication(s): Singulair 10mg  daily and Flovent 166mcg 2 puffs twice daily with spacer - Prior to physical activity: albuterol 2 puffs 10-15 minutes before physical activity. - Rescue medications: albuterol 4 puffs every 4-6 hours as needed - Changes during respiratory infections or worsening symptoms: Increase Flovent 180mcg to 4 puffs twice daily for TWO WEEKS. - Asthma control goals:  * Full participation in all desired activities (may need albuterol before activity) * Albuterol use two time or less a week on average (not counting use with activity) * Cough interfering with sleep two time or less a month * Oral steroids no more than once a year * No hospitalizations  2. 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 - This should have some heath benefits as well. [name] is up to date.  3. Chronic rhinitis (horse, cow, goose, chicken feathers, cat, dog, grasses, trees, ragweed, and weeds) - It seems that avoidance of the allergens has helped to improve your symptoms.  - Continue with: Zatidor one drop per eye twice daily as needed and Singulair (montelukast) 10mg  daily and Xyzal 5mg  daily.  - We can consider allergy shots in the future if needed.   4. Bilateral cerumen impaction - I could not remove the ear wax today since it was so hard. - Put a few drops of olive oil into each ear nightly and let it sit for a few minutes. - Then use warm water to flush out the ear.  - Do this nightly for a week and give [place] an update on Friday. - We can refer you to ENT for further workup if needed at that time.   5. Return in about 6 months (around 10/01/2019). This can be an in-person, a virtual Webex or a telephone follow up visit.   Please inform us of any Emergency  Department visits, hospitalizations, or changes in symptoms. Call us before going to the ED for breathing or allergy symptoms since we might be able to fit you in for a sick visit. Feel free to contact us anytime with any questions, problems, or concerns.  It was a pleasure to see you again today!  Websites that have reliable patient information: 1. American Academy of Asthma, Allergy, and Immunology: www.aaaai.org 2. Food Allergy Research and Education (FARE): foodallergy.org 3. Mothers of Asthmatics: http://www.asthmacommunitynetwork.org 4. American College of Allergy, Asthma, and Immunology: www.acaai.org  "Like" [place] on Facebook and Instagram for our latest updates!      Make sure you are registered to vote! If you have moved or changed any of your contact information, you will need to get this updated before voting!  In some cases, you MAY be able to register to vote online: [email or web address]    Voter ID laws are NOT going into effect for the General Election in November 2020! DO NOT let this stop you from exercising your right to vote!   Absentee voting is the SAFEST way to vote during the coronavirus pandemic!   Download and print an absentee ballot request form at [email or web address] or you can scan the QR code below with your smart phone:      More information on absentee ballots can be found here: [email or web address]

## 2019-03-31 NOTE — Progress Notes (Addendum)
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 Mild persistent asthma, uncomplicated - We did not do lung testing today. - It seems that the current medication regimen is working well.  - Daily controller medication(s): Singulair 10mg  daily and Flovent 166mcg 2 puffs twice daily with spacer - Prior to physical activity: albuterol 2 puffs 10-15 minutes before physical activity. - Rescue medications: albuterol 4 puffs every 4-6 hours as needed - Changes during respiratory infections or worsening symptoms: Increase Flovent 145mcg to 4 puffs twice daily for TWO WEEKS. - Asthma control goals:  * Full participation in all desired activities (may need albuterol before activity) * Albuterol use two time or less a week on average (not counting use with activity) * Cough interfering with sleep two time or less a month * Oral steroids no more than once a year * No hospitalizations  2. 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 - This should have some heath benefits as well. [name] is up to date.  3. Chronic rhinitis (horse, cow, goose, chicken feathers, cat, dog, grasses, trees, ragweed, and weeds) - It seems that avoidance of the allergens has helped to improve your symptoms.  - Continue with: Zatidor one drop per eye twice daily as needed and Singulair (montelukast) 10mg  daily and Xyzal 5mg  daily.  - We can consider allergy shots in the future if needed.   4. Bilateral cerumen impaction - I could not remove the ear wax today since it was so hard. - Put a few drops of olive oil into each ear  nightly and let it sit for a few minutes. - Then use warm water to flush out the ear.  - Do this nightly for a week and give [place] an update on Friday. - We can refer you to ENT for further workup if needed at that time.   5. Return in about 6 months (around 10/01/2019). This can be an in-person, a virtual Webex or a telephone follow up visit.   Subjective:   [name] is a 58 y.o. female presenting today for follow up of  Chief Complaint  Patient presents with  . Follow-up    asthma     [name] has a history of the following: Patient Active Problem List   Diagnosis Date Noted  . Vitamin D insufficiency 07/10/2018  . Type 2 diabetes mellitus with stage 3 chronic kidney disease, without long-term current use of insulin ([place]) 07/10/2018  . GERD (gastroesophageal reflux disease) 07/07/2018  . Dysphagia 07/07/2018  . Hypercalcemia 07/07/2018  . Multinodular goiter 07/07/2018  . Fatty liver 07/07/2018  . Dyslipidemia 07/07/2018  . Essential hypertension 07/07/2018  . Morbid obesity ([place]) 07/07/2018  . Insomnia 07/07/2018  . Chronic pelvic pain in female 07/07/2018  . Chronic bilateral low back pain without sciatica 07/07/2018  . Dyspareunia in female 07/07/2018  . 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 Hashimoto's disease 07/07/2018  . Myalgia 07/07/2018  . Sympathetically maintained pain 07/07/2018  . Status post hysterectomy with oophorectomy:  1998 or so;  non- cancerous reasons 07/07/2018  . AKI (acute kidney injury) ([place]) 05/08/2018  . Hepatic  steatosis 05/08/2018  . Type 2 diabetes mellitus with diabetic nephropathy ([place]) 10/02/2017  . Asthma without status asthmaticus 10/02/2017  . HLD (hyperlipidemia) 10/02/2017  . Anxiety disorder 10/02/2017  . Chest pain 10/22/2013  . Abnormal ECG 10/12/2013  . Melanoma ([place]) 09/17/2013  . Dysphagia, unspecified(787.20) 04/01/2013  . Hypothyroidism 04/01/2013  . Thyroid nodule 03/30/2013  . Nocturia 08/06/2011  . Pelvic  pain 08/06/2011  . INSOMNIA, CHRONIC 01/02/2010  . EDEMA 01/02/2010  . 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[place]) 11/05/2007  . SYNCOPE 11/05/2007  . SIALADENITIS 11/03/2007  . ADJUSTMENT DISORDER WITH ANXIETY 10/27/2007  . Benign neoplasm of skin 09/19/2007  . Hypertension associated with diabetes ([place]) 09/19/2007  . Hyperlipidemia associated with type 2 diabetes mellitus ([place]) 08/25/2007  . OTHER ABNORMAL GLUCOSE 08/25/2007  . Anemia 05/26/2007  . 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 05/22/2007  . History of  ALCOHOL ABUSE 05/22/2007  . DEPRESSION 05/22/2007  . ALLERGIC RHINITIS 05/22/2007  . Reactive airway disease 05/22/2007  . h/o Seizures ([place]) 05/22/2007  . Environmental and seasonal allergies 05/22/2007    History obtained from: chart review and patient.  [name] is a 58 y.o. female presenting for a follow up visit.  She was last seen in April 2020.  At that time, she had came with positive testing to horse, cow, goose, and chicken feathers.  We obtained an environmental allergy panel that demonstrated positives to cat, dog, grasses, trees, ragweed, and weeds.  We continued her on Zaditor twice daily as needed as well as Singulair.  We also gave her a sample of Xyzal.  She had testing that was positive to eggs and milk prior to her seeing me.  We recommended avoidance.  She was having symptoms consistent with anaphylaxis.  We gave her a prescription for Auvi-Q and an anaphylaxis management plan.  With regards to her breathing, we continued Singulair and Flovent 110 mcg 2 puffs twice daily.  Asthma/Respiratory Symptom History: She reports that she remaisn on the Flovent two [puffs tiwce daily with the spacer. She is complaining about the masks today on multiple times.   Allergic Rhinitis Symptom History: Environmental allergies have been fairly well controlled. She reports that the spring time seemed to be worse. She does report sore throats when she comes in with allergy  symptoms. She remains on the Zatidor as needed. She is on levocetirizine 5mg  which she feels that she has less of a hangover of sorts.   Food Allergy Symptom History: She has changed to a vegan diet which has helped. She does a lot of goat cheese and seems to do fine with that. The cow's milk seems to be the big trigger. She is trying the nut cheese as well. She has found that the goat cheese melts better than the nut cheeses. She is avoiding eggs as well as read meat, poultry, and pork. She did have an episode when she reacted to cross contamination when he daughter made cookies.   Otherwise, there have been no changes to her past medical history, surgical history, family history, or social history. Thankfully she is able to work from home. Her husband was an Chief Financial Officer with [employer] and he was laid off in May, thankfully with a three month severance package.     Review of Systems  Constitutional: Negative.  Negative for chills, fever, malaise/fatigue and weight loss.  HENT: Negative for congestion, ear discharge, ear pain and sore throat.   Eyes: Negative for pain, discharge and redness.  Respiratory: Negative  for cough, sputum production, shortness of breath and wheezing.   Cardiovascular: Negative.  Negative for chest pain and palpitations.  Gastrointestinal: Negative for abdominal pain, heartburn, nausea and vomiting.  Skin: Negative.  Negative for itching and rash.  Neurological: Negative for dizziness and headaches.  Endo/Heme/Allergies: Negative for environmental allergies. Does not bruise/bleed easily.       Objective:   Blood pressure (!) 152/98, pulse 88, temperature 97.9 F (36.6 C), resp. rate 16, height 5' 3.5" (1.613 m), weight 230 lb 3.2 oz (104.4 kg), SpO2 97 %. Body mass index is 40.14 kg/m.   Physical Exam:  Physical Exam  Constitutional: She appears well-developed.  Obese female. Pleasant.   HENT:  Head: Normocephalic and atraumatic.  Right Ear: Tympanic  membrane, external ear and ear canal normal.  Left Ear: Tympanic membrane and ear canal normal.  Nose: No mucosal edema, rhinorrhea, nasal deformity or septal deviation. No epistaxis. Right sinus exhibits no maxillary sinus tenderness and no frontal sinus tenderness. Left sinus exhibits no maxillary sinus tenderness and no frontal sinus tenderness.  Mouth/Throat: Uvula is midline and oropharynx is clear and moist. Mucous membranes are not pale and not dry.  Eyes: Pupils are equal, round, and reactive to light. Conjunctivae and EOM are normal. Right eye exhibits no chemosis and no discharge. Left eye exhibits no chemosis and no discharge. Right conjunctiva is not injected. Left conjunctiva is not injected.  Cardiovascular: Normal rate, regular rhythm and normal heart sounds.  Respiratory: Effort normal and breath sounds normal. No accessory muscle usage. No tachypnea. No respiratory distress. She has no wheezes. She has no rhonchi. She has no rales. She exhibits no tenderness.  Moving air well in all lung fields.  Lymphadenopathy:    She has no cervical adenopathy.  Neurological: She is alert.  Skin: No abrasion, no petechiae and no rash noted. Rash is not papular, not vesicular and not urticarial. No erythema. No pallor.  No eczematous or urticarial lesions noted.   Psychiatric: She has a normal mood and affect.     Diagnostic studies: none     Salvatore Marvel, MD  [facility]

## 2019-04-01 ENCOUNTER — Telehealth: Payer: Self-pay

## 2019-04-01 DIAGNOSIS — B379 Candidiasis, unspecified: Secondary | ICD-10-CM

## 2019-04-01 MED ORDER — FLUCONAZOLE 150 MG PO TABS: 150.0000 mg | ORAL_TABLET | Freq: Once | ORAL | 0 refills | Status: AC

## 2019-04-01 NOTE — Telephone Encounter (Signed)
Dr. Hershal Coria message: Please send 150 mg Diflucan 1 p.o. now then repeat 1 tablet p.o. in 1 week.  Dispense #2, 1 refill  See MyChart message  Med sent in and patient notified. MPulliam, CMA/RT(R)

## 2019-04-02 ENCOUNTER — Encounter: Payer: Self-pay | Admitting: Allergy & Immunology

## 2019-04-02 NOTE — Addendum Note (Signed)
Addended by: Valentina Shaggy on: 04/02/2019 10:05 AM   Modules accepted: Orders

## 2019-04-07 NOTE — Progress Notes (Signed)
I have printed the referral and notes and faxed all information to [facility].  Will follow up with [facility] in 2 days to make sure they received referral.

## 2019-04-08 NOTE — Telephone Encounter (Signed)
Hello Keina,  Sorry for the delay.   I have you scheduled to see the Nurse Practitioner Jolene Provost on tomorrow @ 1 P.M.   [address] [place] [address] [phone]  If you have any questions you can give me a call at [phone].  Thanks

## 2019-04-08 NOTE — Progress Notes (Signed)
Hello [place],  Sorry for the delay.   I have you scheduled to see the Nurse Practitioner Jolene Provost on tomorrow @ 1 P.M.   [address] [place] [address] [phone]  If you have any questions you can give me a call at [phone].  Thanks

## 2019-04-09 ENCOUNTER — Telehealth: Payer: Self-pay | Admitting: Family Medicine

## 2019-04-09 NOTE — Telephone Encounter (Signed)
Forwarding message to medical assistant that pt called while office clsd for Lunch states she is still having pain & request call back to set up appt.  --Called pt back to advise Dr. Jenetta Downer is out on vacation & med asst out of office today as well--- Left msg for pt to go to nearest Urgent Care or Emergency Dept for trmt if symptoms worsen & to call office back to set up appt possibly for one day next week although all appts full for Dr. Jenetta Downer Valetta Fuller does have some appts open).  --glh

## 2019-04-27 ENCOUNTER — Other Ambulatory Visit: Payer: Self-pay

## 2019-04-27 ENCOUNTER — Encounter: Payer: Self-pay | Admitting: Family Medicine

## 2019-04-27 ENCOUNTER — Ambulatory Visit (INDEPENDENT_AMBULATORY_CARE_PROVIDER_SITE_OTHER): Payer: 59 | Admitting: Family Medicine

## 2019-04-27 VITALS — BP 136/84 | HR 84 | Temp 98.6°F | Ht 63.0 in | Wt 230.4 lb

## 2019-04-27 DIAGNOSIS — M25569 Pain in unspecified knee: Secondary | ICD-10-CM | POA: Insufficient documentation

## 2019-04-27 DIAGNOSIS — E1121 Type 2 diabetes mellitus with diabetic nephropathy: Secondary | ICD-10-CM

## 2019-04-27 DIAGNOSIS — Z862 Personal history of diseases of the blood and blood-forming organs and certain disorders involving the immune mechanism: Secondary | ICD-10-CM

## 2019-04-27 DIAGNOSIS — M25561 Pain in right knee: Secondary | ICD-10-CM

## 2019-04-27 DIAGNOSIS — G8929 Other chronic pain: Secondary | ICD-10-CM

## 2019-04-27 LAB — POCT GLYCOSYLATED HEMOGLOBIN (HGB A1C): Hemoglobin A1C: 6.4 % — AB (ref 4.0–5.6)

## 2019-04-27 NOTE — Patient Instructions (Addendum)
Please try to do your Lilac meditation twice daily for 10 to 15 minutes in the morning and in the evening.  This check your blood pressure and it should consistently be less than 135/85 on a regular basis.  If it is not please let me know prior to your next appointment.  Your appointment with Dr. Maureen Ralphs for your knee per your request to see him.  If you have not heard anything by Thursday, please call us first thing Thursday afternoon.    Commit to lose / MFP apps for tracking.       Behavior Modification Ideas for Weight Management  Weight management involves adopting a healthy lifestyle that includes a knowledge of nutrition and exercise, a positive attitude and the right kind of motivation. Internal motives such as better health, increased energy, self-esteem and personal control increase your chances of lifelong weight management success.  Remember to have realistic goals and think long-term success. Believe in yourself and you can do it. The following information will give you ideas to help you meet your goals.  Control Your Home Environment  Eat only while sitting down at the kitchen or dining room table. Do not eat while watching television, reading, cooking, talking on the phone, standing at the refrigerator or working on the computer. Keep tempting foods out of the house - don't buy them. Keep tempting foods out of sight. Have low-calorie foods ready to eat. Unless you are preparing a meal, stay out of the kitchen. Have healthy snacks at your disposal, such as small pieces of fruit, vegetables, canned fruit, pretzels, low-fat string cheese and nonfat cottage cheese.  Control Your Work Environment  Do not eat at [employer] or keep tempting snacks at your desk. If you get hungry between meals, plan healthy snacks and bring them with you to work. During your breaks, go for a walk instead of eating. If you work around food, plan in advance the one item you will eat at mealtime. Make  it inconvenient to nibble on food by chewing gum, sugarless candy or drinking water or another low-calorie beverage. Do not work through meals. Skipping meals slows down metabolism and may result in overeating at the next meal. If food is available for special occasions, either pick the healthiest item, nibble on low-fat snacks brought from home, don't have anything offered, choose one option and have a small amount, or have only a beverage.  Control Your Mealtime Environment  Serve your plate of food at the stove or kitchen counter. Do not put the serving dishes on the table. If you do put dishes on the table, remove them immediately when finished eating. Fill half of your plate with vegetables, a quarter with lean protein and a quarter with starch. Use smaller plates, bowls and glasses. A smaller portion will look large when it is in a little dish. Politely refuse second helpings. When fixing your plate, limit portions of food to one scoop/serving or less.   Daily Food Management  Replace eating with another activity that you will not associate with food. Wait 20 minutes before eating something you are craving. Drink a large glass of water or diet soda before eating. Always have a big glass or bottle of water to drink throughout the day. Avoid high-calorie add-ons such as cream with your coffee, butter, mayonnaise and salad dressings.  Shopping: Do not shop when hungry or tired. Shop from a list and avoid buying anything that is not on your list. If you must  have tempting foods, buy individual-sized packages and try to find a lower-calorie alternative. Don't taste test in the store. Read food labels. Compare products to help you make the healthiest choices.  Preparation: Chew a piece of gum while cooking meals. Use a quarter teaspoon if you taste test your food. Try to only fix what you are going to eat, leaving yourself no chance for seconds. If you have prepared more food than you  need, portion it into individual containers and freeze or refrigerate immediately. Don't snack while cooking meals.  Eating: Eat slowly. Remember it takes about 20 minutes for your stomach to send a message to your brain that it is full. Don't let fake hunger make you think you need more. The ideal way to eat is to take a bite, put your utensil down, take a sip of water, cut your next bite, take a bit, put your utensil down and so on. Do not cut your food all at one time. Cut only as needed. Take small bites and chew your food well. Stop eating for a minute or two at least once during a meal or snack. Take breaks to reflect and have conversation.  Cleanup and Leftovers: Label leftovers for a specific meal or snack. Freeze or refrigerate individual portions of leftovers. Do not clean up if you are still hungry.  Eating Out and Social Eating  Do not arrive hungry. Eat something light before the meal. Try to fill up on low-calorie foods, such as vegetables and fruit, and eat smaller portions of the high-calorie foods. Eat foods that you like, but choose small portions. If you want seconds, wait at least 20 minutes after you have eaten to see if you are actually hungry or if your eyes are bigger than your stomach. Limit alcoholic beverages. Try a soda water with a twist of lime. Do not skip other meals in the day to save room for the special event.  At Restaurants: Order  la carte rather than buffet style. Order some vegetables or a salad for an appetizer instead of eating bread. If you order a high-calorie dish, share it with someone. Try an after-dinner mint with your coffee. If you do have dessert, share it with two or more people. Don't overeat because you do not want to waste food. Ask for a doggie bag to take extra food home. Tell the server to put half of your entree in a to go bag before the meal is served to you. Ask for salad dressing, gravy or high-fat sauces on the side. Dip  the tip of your fork in the dressing before each bite. If bread is served, ask for only one piece. Try it plain without butter or oil. At [employer] where oil and vinegar is served with bread, use only a small amount of oil and a lot of vinegar for dipping.  At a [facility]: Offer to bring a dish, appetizer or dessert that is low in calories. Serve yourself small portions or tell the host that you only want a small amount. Stand or sit away from the snack table. Stay away from the kitchen or stay busy if you are near the food. Limit your alcohol intake.  At [employer] and Cafeterias: Cover most of your plate with lettuce and/or vegetables. Use a salad plate instead of a dinner plate. After eating, clear away your dishes before having coffee or tea.  Entertaining at Home: Explore low-fat, low-cholesterol cookbooks. Use single-serving foods like chicken breasts or hamburger  patties. Prepare low-calorie appetizers and desserts.   Holidays: Keep tempting foods out of sight. Decorate the house without using food. Have low-calorie beverages and foods on hand for guests. Allow yourself one planned treat a day. Don't skip meals to save up for the holiday feast. Eat regular, planned meals.   Exercise Well  Make exercise a priority and a planned activity in the day. If possible, walk the entire or part of the distance to work. Get an exercise buddy. Go for a walk with a colleague during one of your breaks, go to the gym, run or take a walk with a friend, walk in the mall with a shopping companion. Park at the end of the parking lot and walk to the store or office entrance. Always take the stairs all of the way or at least part of the way to your floor. If you have a desk job, walk around the office frequently. Do leg lifts while sitting at your desk. Do something outside on the weekends like going for a hike or a bike ride.   Have a Healthy Attitude  Make health your weight  management priority. Be realistic. Have a goal to achieve a healthier you, not necessarily the lowest weight or ideal weight based on calculations or tables. Focus on a healthy eating style, not on dieting. Dieting usually lasts for a short amount of time and rarely produces long-term success. Think long term. You are developing new healthy behaviors to follow next month, in a year and in a decade.    This information is for educational purposes only and is not intended to replace the advice of your doctor or health care provider. We encourage you to discuss with your doctor any questions or concerns you may have.        Guidelines for Losing Weight   We want weight loss that will last so you should lose 1-2 pounds a week.  THAT IS IT! Please pick THREE things a month to change. Once it is a habit check off the item. Then pick another three items off the list to become habits.  If you are already doing a habit on the list GREAT!  Cross that item off!  Don't drink your calories. Ie, alcohol, soda, fruit juice, and sweet tea.   Drink more water. Drink a glass when you feel hungry or before each meal.   Eat breakfast - Complex carb and protein (likeDannon light and fit yogurt, oatmeal, fruit, eggs, [place] bacon).  Measure your cereal.  Eat no more than one cup a day. (ie Kashi)  Eat an apple a day.  Add a vegetable a day.  Try a new vegetable a month.  Use Pam! Stop using oil or butter to cook.  Don't finish your plate or use smaller plates.  Share your dessert.  Eat sugar free Jello for dessert or frozen grapes.  Don't eat 2-3 hours before bed.  Switch to whole wheat bread, pasta, and brown rice.  Make healthier choices when you eat out. No fries!  Pick baked chicken, NOT fried.  Don't forget to SLOW DOWN when you eat. It is not going anywhere.   Take the stairs.  Park far away in the parking lot  Lift soup cans (or weights) for 10 minutes while watching  TV.  Walk at work for 10 minutes during break.  Walk outside 1 time a week with your friend, kids, dog, or significant other.  Start a walking group at church.  Walk the mall as much as you can tolerate.   Keep a food diary.  Weigh yourself daily.  Walk for 15 minutes 3 days per week.  Cook at home more often and eat out less. If life happens and you go back to old habits, it is okay.  Just start over. You can do it!  If you experience chest pain, get short of breath, or tired during the exercise, please stop immediately and inform your doctor.    Before you even begin to attack a weight-loss plan, it pays to remember this: You are not fat. You have fat. Losing weight isn't about blame or shame; it's simply another achievement to accomplish. Dieting is like any other skill-you have to buckle down and work at it. As long as you act in a smart, reasonable way, you'll ultimately get where you want to be. Here are some weight loss pearls for you.   1. It's Not a Diet. It's a Lifestyle Thinking of a diet as something you're on and suffering through only for the short term doesn't work. To shed weight and keep it off, you need to make permanent changes to the way you eat. It's OK to indulge occasionally, of course, but if you cut calories temporarily and then revert to your old way of eating, you'll gain back the weight quicker than you can say yo-yo. Use it to lose it. Research shows that one of the best predictors of long-term weight loss is how many pounds you drop in the first month. For that reason, nutritionists often suggest being stricter for the first two weeks of your new eating strategy to build momentum. Cut out added sugar and alcohol and avoid unrefined carbs. After that, figure out how you can reincorporate them in a way that's healthy and maintainable.  2. There's a Right Way to Exercise Working out burns calories and fat and boosts your metabolism by building muscle. But those  trying to lose weight are notorious for overestimating the number of calories they burn and underestimating the amount they take in. Unfortunately, your system is biologically programmed to hold on to extra pounds and that means when you start exercising, your body senses the deficit and ramps up its hunger signals. If you're not diligent, you'll eat everything you burn and then some. Use it, to lose it. Cardio gets all the exercise glory, but strength and interval training are the real heroes. They help you build lean muscle, which in turn increases your metabolism and calorie-burning ability 3. Don't Overreact to Mild Hunger Some people have a hard time losing weight because of hunger anxiety. To them, being hungry is bad-something to be avoided at all costs-so they carry snacks with them and eat when they don't need to. Others eat because they're stressed out or bored. While you never want to get to the point of being ravenous (that's when bingeing is likely to happen), a hunger pang, a craving, or the fact that it's 3:00 p.m. should not send you racing for the vending machine or obsessing about the energy bar in your purse. Ideally, you should put off eating until your stomach is growling and it's difficult to concentrate.  Use it to lose it. When you feel the urge to eat, use the HALT method. Ask yourself, Am I really hungry? Or am I angry or anxious, lonely or bored, or tired? If you're still not certain, try the apple test. If you're truly hungry, an apple should seem delicious;  if it doesn't, something else is going on. Or you can try drinking water and making yourself busy, if you are still hungry try a healthy snack.  4. Not All Calories Are Created Equal The mechanics of weight loss are pretty simple: Take in fewer calories than you use for energy. But the kind of food you eat makes all the difference. Processed food that's high in saturated fat and refined starch or sugar can cause inflammation  that disrupts the hormone signals that tell your brain you're full. The result: You eat a lot more.  Use it to lose it. Clean up your diet. Swap in whole, unprocessed foods, including vegetables, lean protein, and healthy fats that will fill you up and give you the biggest nutritional bang for your calorie buck. In a few weeks, as your brain starts receiving regular hunger and fullness signals once again, you'll notice that you feel less hungry overall and naturally start cutting back on the amount you eat.  5. Protein, Produce, and Plant-Based Fats Are Your Weight-Loss Trinity Here's why eating the three Ps regularly will help you drop pounds. Protein fills you up. You need it to build lean muscle, which keeps your metabolism humming so that you can torch more fat. People in a weight-loss program who ate double the recommended daily allowance for protein (about 110 grams for a 150-pound woman) lost 70 percent of their weight from fat, while people who ate the RDA lost only about 40 percent, one study found. Produce is packed with filling fiber. "It's very difficult to consume too many calories if you're eating a lot of vegetables. Example: Three cups of broccoli is a lot of food, yet only 93 calories. (Fruit is another story. It can be easy to overeat and can contain a lot of calories from sugar, so be sure to monitor your intake.) Plant-based fats like olive oil and those in avocados and nuts are healthy and extra satiating.  Use it to lose it. Aim to incorporate each of the three Ps into every meal and snack. People who eat protein throughout the day are able to keep weight off, according to a study in the [place] [employer]. In addition to meat, poultry and seafood, good sources are beans, lentils, eggs, tofu, and yogurt. As for fat, keep portion sizes in check by measuring out salad dressing, oil, and nut butters (shoot for one to two tablespoons). Finally, eat veggies or a little  fruit at every meal. People who did that consumed 308 fewer calories but didn't feel any hungrier than when they didn't eat more produce.  7. How You Eat Is As Important As What You Eat In order for your brain to register that you're full, you need to focus on what you're eating. Sit down whenever you eat, preferably at a table. Turn off the TV or computer, put down your phone, and look at your food. Smell it. Chew slowly, and don't put another bite on your fork until you swallow. When women ate lunch this attentively, they consumed 30 percent less when snacking later than those who listened to an audiobook at lunchtime, according to a study in the [place] [employer]. 8. Weighing Yourself Really Works The scale provides the best evidence about whether your efforts are paying off. Seeing the numbers tick up or down or stagnate is motivation to keep going-or to rethink your approach. A 2015 study at [facility] found that daily weigh-ins helped people lose more  weight, keep it off, and maintain that loss, even after two years. Use it to lose it. Step on the scale at the same time every day for the best results. If your weight shoots up several pounds from one weigh-in to the next, don't freak out. Eating a lot of salt the night before or having your period is the likely culprit. The number should return to normal in a day or two. It's a steady climb that you need to do something about. 9. Too Much Stress and Too Little Sleep Are Your Enemies When you're tired and frazzled, your body cranks up the production of cortisol, the stress hormone that can cause carb cravings. Not getting enough sleep also boosts your levels of ghrelin, a hormone associated with hunger, while suppressing leptin, a hormone that signals fullness and satiety. People on a diet who slept only five and a half hours a night for two weeks lost 55 percent less fat and were hungrier than those who slept eight and a half hours,  according to a study in the [place]. Use it to lose it. Prioritize sleep, aiming for seven hours or more a night, which research shows helps lower stress. And make sure you're getting quality zzz's. If a snoring spouse or a fidgety cat wakes you up frequently throughout the night, you may end up getting the equivalent of just four hours of sleep, according to a study from [facility]. Keep pets out of the bedroom, and use a white-noise app to drown out snoring. 10. You Will Hit a plateau-And You Can Bust Through It As you slim down, your body releases much less leptin, the fullness hormone.  If you're not strength training, start right now. Building muscle can raise your metabolism to help you overcome a plateau. To keep your body challenged and burning calories, incorporate new moves and more intense intervals into your workouts or add another sweat session to your weekly routine. Alternatively, cut an extra 100 calories or so a day from your diet. Now that you've lost weight, your body simply doesn't need as much fuel.    Since food equals calories, in order to lose weight you must either eat fewer calories, exercise more to burn off calories with activity, or both. Food that is not used to fuel the body is stored as fat. A major component of losing weight is to make smarter food choices. Here's how:  1)   Limit non-nutritious foods, such as: Sugar, honey, syrups and candy Pastries, donuts, pies, cakes and cookies Soft drinks, sweetened juices and alcoholic beverages  2)  Cut down on high-fat foods by: - Choosing poultry, fish or lean red meat - Choosing low-fat cooking methods, such as baking, broiling, steaming, grilling and boiling - Using low-fat or non-fat dairy products - Using vinaigrette, herbs, lemon or fat-free salad dressings - Avoiding fatty meats, such as bacon, sausage, franks, ribs and luncheon meats - Avoiding high-fat snacks like nuts,  chips and chocolate - Avoiding fried foods - Using less butter, margarine, oil and mayonnaise - Avoiding high-fat gravies, cream sauces and cream-based soups  3) Eat a variety of foods, including: - Fruit and vegetables that are raw, steamed or baked - Whole grains, breads, cereal, rice and pasta - Dairy products, such as low-fat or non-fat milk or yogurt, low-fat cottage cheese and low-fat cheese - Protein-rich foods like chicken, [place], fish, lean meat and legumes, or beans  4) Change your eating habits by: -  Eat three balanced meals a day to help control your hunger - Watch portion sizes and eat small servings of a variety of foods - Choose low-calorie snacks - Eat only when you are hungry and stop when you are satisfied - Eat slowly and try not to perform other tasks while eating - Find other activities to distract you from food, such as walking, taking up a hobby or being involved in the community - Include regular exercise in your daily routine ( minimum of 20 min of moderate-intensity exercise at least 5 days/week)  - Find a support group, if necessary, for emotional support in your weight loss journey           Easy ways to cut 100 calories   1. Eat your eggs with hot sauce OR salsa instead of cheese.  Eggs are great for breakfast, but many people consider eggs and cheese to be BFFs. Instead of cheese-1 oz. of cheddar has 114 calories-top your eggs with hot sauce, which contains no calories and helps with satiety and metabolism. Salsa is also a great option!!  2. Top your toast, waffles or pancakes with fresh berries instead of jelly or syrup. Half a cup of berries-fresh, frozen or thawed-has about 40 calories, compared with 2 tbsp. of maple syrup or jelly, which both have about 100 calories. The berries will also give you a good punch of fiber, which helps keep you full and satisfied and won't spike blood sugar quickly like the jelly or syrup. 3. Swap the non-fat latte  for black coffee with a splash of half-and-half. Contrary to its name, that non-fat latte has 130 calories and a startling 19g of carbohydrates per 16 oz. serving. Replacing that 'light' drinkable dessert with a black coffee with a splash of half-and-half saves you more than 100 calories per 16 oz. serving. 4. Sprinkle salads with freeze-dried raspberries instead of dried cranberries. If you want a sweet addition to your nutritious salad, stay away from dried cranberries. They have a whopping 130 calories per  cup and 30g carbohydrates. Instead, sprinkle freeze-dried raspberries guilt-free and save more than 100 calories per  cup serving, adding 3g of belly-filling fiber. 5. Go for mustard in place of mayo on your sandwich. Mustard can add really nice flavor to any sandwich, and there are tons of varieties, from spicy to honey. A serving of mayo is 95 calories, versus 10 calories in a serving of mustard.  Or try an avocado mayo spread: You can find the recipe few click this link: https://www.californiaavocado.com/recipes/recipe-container/[place]-avocado-mayo 6. Choose a DIY salad dressing instead of the store-bought kind. Mix Dijon or whole grain mustard with low-fat Kefir or red wine vinegar and garlic. 7. Use hummus as a spread instead of a dip. Use hummus as a spread on a high-fiber cracker or tortilla with a sandwich and save on calories without sacrificing taste. 8. Pick just one salad "accessory." Salad isn't automatically a calorie winner. It's easy to over-accessorize with toppings. Instead of topping your salad with nuts, avocado and cranberries (all three will clock in at 313 calories), just pick one. The next day, choose a different accessory, which will also keep your salad interesting. You don't wear all your jewelry every day, right? 9. Ditch the white pasta in favor of spaghetti squash. One cup of cooked spaghetti squash has about 40 calories, compared with traditional spaghetti,  which comes with more than 200. Spaghetti squash is also nutrient-dense. It's a good source of fiber and Vitamins A and  C, and it can be eaten just like you would eat pasta-with a great tomato sauce and [place] meatballs or with pesto, tofu and spinach, for example. 10. Dress up your chili, soups and stews with non-fat [place] yogurt instead of sour cream. Just a 'dollop' of sour cream can set you back 115 calories and a whopping 12g of fat-seven of which are of the artery-clogging variety. Added bonus: [place] yogurt is packed with muscle-building protein, calcium and B Vitamins. 11. Mash cauliflower instead of mashed potatoes. One cup of traditional mashed potatoes-in all their creamy goodness-has more than 200 calories, compared to mashed cauliflower, which you can typically eat for less than 100 calories per 1 cup serving. Cauliflower is a great source of the antioxidant indole-3-carbinol (I3C), which may help reduce the risk of some cancers, like breast cancer. 12. Ditch the ice cream sundae in favor of a [place] yogurt parfait. Instead of a cup of ice cream or fro-yo for dessert, try 1 cup of nonfat Greek yogurt topped with fresh berries and a sprinkle of cacao nibs. Both toppings are packed with antioxidants, which can help reduce cellular inflammation and oxidative damage. And the comparison is a no-brainer: One cup of ice cream has about 275 calories; one cup of frozen yogurt has about 230; and a cup of Greek yogurt has just 130, plus twice the protein, so you're less likely to return to the freezer for a second helping. 13. Put olive oil in a spray container instead of using it directly from the bottle. Each tablespoon of olive oil is 120 calories and 15g of fat. Use a mister instead of pouring it straight into the pan or onto a salad. This allows for portion control and will save you more than 100 calories. 14. When baking, substitute canned pumpkin for butter or oil. Canned pumpkin-not pumpkin pie  mix-is loaded with Vitamin A, which is important for skin and eye health, as well as immunity. And the comparisons are pretty crazy:  cup of canned pumpkin has about 40 calories, compared to butter or oil, which has more than 800 calories. Yes, 800 calories. Applesauce and mashed banana can also serve as good substitutions for butter or oil, usually in a 1:1 ratio. 15. Top casseroles with high-fiber cereal instead of breadcrumbs. Breadcrumbs are typically made with white bread, while breakfast cereals contain 5-9g of fiber per serving. Not only will you save more than 150 calories per  cup serving, the swap will also keep you more full and you'll get a metabolism boost from the added fiber. 16. Snack on pistachios instead of macadamia nuts. Believe it or not, you get the same amount of calories from 35 pistachios (100 calories) as you would from only five macadamia nuts. 17. Chow down on kale chips rather than potato chips. This is my favorite 'don't knock it 'till you try it' swap. Kale chips are so easy to make at home, and you can spice them up with a little grated parmesan or chili powder. Plus, they're a mere fraction of the calories of potato chips, but with the same crunch factor we crave so often. 18. Add seltzer and some fruit slices to your cocktail instead of soda or fruit juice. One cup of soda or fruit juice can pack on as much as 140 calories. Instead, use seltzer and fruit slices. The fruit provides valuable phytochemicals, such as flavonoids and anthocyanins, which help to combat cancer and stave off the aging process.
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
## 2019-04-28 LAB — COMPREHENSIVE METABOLIC PANEL
ALT: 53 IU/L — ABNORMAL HIGH (ref 0–32)
AST: 31 IU/L (ref 0–40)
Albumin/Globulin Ratio: 1.9 (ref 1.2–2.2)
Albumin: 4.5 g/dL (ref 3.8–4.9)
Alkaline Phosphatase: 75 IU/L (ref 39–117)
BUN/Creatinine Ratio: 12 (ref 9–23)
BUN: 14 mg/dL (ref 6–24)
Bilirubin Total: 0.2 mg/dL (ref 0.0–1.2)
CO2: 25 mmol/L (ref 20–29)
Calcium: 9.8 mg/dL (ref 8.7–10.2)
Chloride: 98 mmol/L (ref 96–106)
Creatinine, Ser: 1.2 mg/dL — ABNORMAL HIGH (ref 0.57–1.00)
GFR calc Af Amer: 59 mL/min/{1.73_m2} — ABNORMAL LOW (ref >59)
GFR calc non Af Amer: 51 mL/min/{1.73_m2} — ABNORMAL LOW (ref >59)
Globulin, Total: 2.4 g/dL (ref 1.5–4.5)
Glucose: 155 mg/dL — ABNORMAL HIGH (ref 65–99)
Potassium: 4.5 mmol/L (ref 3.5–5.2)
Sodium: 139 mmol/L (ref 134–144)
Total Protein: 6.9 g/dL (ref 6.0–8.5)

## 2019-04-28 LAB — CBC
Hematocrit: 38.1 % (ref 34.0–46.6)
Hemoglobin: 12.8 g/dL (ref 11.1–15.9)
MCH: 30.4 pg (ref 26.6–33.0)
MCHC: 33.6 g/dL (ref 31.5–35.7)
MCV: 91 fL (ref 79–97)
Platelets: 344 10*3/uL (ref 150–450)
RBC: 4.21 x10E6/uL (ref 3.77–5.28)
RDW: 11.9 % (ref 11.7–15.4)
WBC: 8.8 10*3/uL (ref 3.4–10.8)

## 2019-04-30 ENCOUNTER — Telehealth: Payer: Self-pay

## 2019-04-30 NOTE — Telephone Encounter (Signed)
Patient informed me that [facility] in [place] called her to make appointment.  Patient needs local office in [place].  Can you please review the referral and see what happened.  Patient is aware office is closed on Friday and to expect call early next week. MPulliam, CMA/RT(R)

## 2019-05-01 ENCOUNTER — Encounter: Payer: Self-pay | Admitting: Family Medicine

## 2019-06-18 DIAGNOSIS — M25561 Pain in right knee: Secondary | ICD-10-CM | POA: Insufficient documentation

## 2019-06-19 ENCOUNTER — Other Ambulatory Visit: Payer: Self-pay | Admitting: Family Medicine

## 2019-06-19 DIAGNOSIS — E1121 Type 2 diabetes mellitus with diabetic nephropathy: Secondary | ICD-10-CM

## 2019-06-19 DIAGNOSIS — G4700 Insomnia, unspecified: Secondary | ICD-10-CM

## 2019-06-22 ENCOUNTER — Other Ambulatory Visit: Payer: Self-pay

## 2019-06-22 DIAGNOSIS — G4700 Insomnia, unspecified: Secondary | ICD-10-CM

## 2019-06-22 MED ORDER — ESZOPICLONE 2 MG PO TABS: ORAL_TABLET | ORAL | 1 refills | Status: AC

## 2019-06-24 ENCOUNTER — Encounter: Payer: Self-pay | Admitting: Family Medicine

## 2019-07-07 DIAGNOSIS — S83249A Other tear of medial meniscus, current injury, unspecified knee, initial encounter: Secondary | ICD-10-CM | POA: Insufficient documentation

## 2019-07-10 ENCOUNTER — Encounter: Payer: Self-pay | Admitting: Family Medicine

## 2019-07-10 ENCOUNTER — Other Ambulatory Visit: Payer: Self-pay

## 2019-07-10 ENCOUNTER — Ambulatory Visit (INDEPENDENT_AMBULATORY_CARE_PROVIDER_SITE_OTHER): Payer: 59 | Admitting: Family Medicine

## 2019-07-10 VITALS — BP 139/89 | HR 75 | Temp 98.7°F | Resp 16 | Ht 63.5 in | Wt 215.8 lb

## 2019-07-10 DIAGNOSIS — E1159 Type 2 diabetes mellitus with other circulatory complications: Secondary | ICD-10-CM | POA: Diagnosis not present

## 2019-07-10 DIAGNOSIS — Z862 Personal history of diseases of the blood and blood-forming organs and certain disorders involving the immune mechanism: Secondary | ICD-10-CM | POA: Diagnosis not present

## 2019-07-10 DIAGNOSIS — I152 Hypertension secondary to endocrine disorders: Secondary | ICD-10-CM

## 2019-07-10 DIAGNOSIS — E1121 Type 2 diabetes mellitus with diabetic nephropathy: Secondary | ICD-10-CM

## 2019-07-10 DIAGNOSIS — Z719 Counseling, unspecified: Secondary | ICD-10-CM

## 2019-07-10 DIAGNOSIS — Z0000 Encounter for general adult medical examination without abnormal findings: Secondary | ICD-10-CM | POA: Diagnosis not present

## 2019-07-10 DIAGNOSIS — E039 Hypothyroidism, unspecified: Secondary | ICD-10-CM

## 2019-07-10 DIAGNOSIS — E559 Vitamin D deficiency, unspecified: Secondary | ICD-10-CM

## 2019-07-10 DIAGNOSIS — I10 Essential (primary) hypertension: Secondary | ICD-10-CM

## 2019-07-10 DIAGNOSIS — G4700 Insomnia, unspecified: Secondary | ICD-10-CM

## 2019-07-10 LAB — POCT UA - MICROALBUMIN
Creatinine, POC: 50 mg/dL
Microalbumin Ur, POC: 30 mg/L

## 2019-07-10 NOTE — Progress Notes (Signed)
Impression and Recommendations:    1. Encounter for wellness examination   2. Health education/counseling   3. Type 2 diabetes mellitus with diabetic nephropathy, unspecified whether long term insulin use ([place])   4. History of anemia   5. Hypertension associated with diabetes ([place])   6. Morbid obesity ([place])   7. Hypothyroidism, unspecified type   8. INSOMNIA, CHRONIC   9. Vitamin D insufficiency   10. Health care maintenance     1) Anticipatory Guidance: Discussed importance of wearing a seatbelt while driving, not texting while driving; sunscreen when outside along with yearly skin surveillance; eating a well balanced and modest diet; physical activity at least 25 minutes per day or 150 min/ week of moderate to intense activity.  - Advised patient to go to dermatology for any new areas of skin concern that are changing or growing, especially given patient's history of melanoma.  - Reviewed prudent home skin screening habits, ideally performed three times per year.  - Encouraged patient to follow up with dermatology at least once yearly.  - Advised patient to use cornstarch / antifungal sweat absorbing powers on areas of skin that remain overlapping, folded over each other, or otherwise kept moist and not open to air.  - Discussed prudent self-breast screening habits with patient today.   2) Immunizations / Screenings / Labs:   All immunizations and screenings that patient agrees to, are up-to-date per recommendations or will be updated today.  Patient understands the needs for q 50mo dental and yearly vision screens which pt will schedule independently. Obtain CBC, CMP, HgA1c, Lipid panel, TSH and vit D when fasting if not already done recently.   - Full fasting blood work will be obtained.  - Last mammogram obtained Nov 10 2018, UTD.  - Last colonoscopy obtained in 2017, UTD, 10 year repeat.  - Last pap smear obtained 2009, pt s/p hysterectomy and oophorectomy.  -  TDAP UTD.  - Per pt, thinks she tried to have the Shingrix vaccine, but had an allergic reaction to eggs. - Patient will obtain info from prior clinic PRN and have it sent here.  - Need for diabetic eye exam in near future. - Diabetic foot exam obtained by CMA today.   3) Weight:   Discussed goal of losing even 5-10% of current body weight which would improve overall feelings of well being and improve objective health data significantly.   Improve nutrient density of diet through increasing intake of fruits and vegetables and decreasing saturated/trans fats, white flour products and refined sugar products.    4) BMI Counseling - Body mass index is 37.63 kg/m Explained to patient what BMI refers to, and what it means medically.    Told patient to think about it as a "medical risk stratification measurement" and how increasing BMI is associated with increasing risk/ or worsening state of various diseases such as hypertension, hyperlipidemia, diabetes, premature OA, depression etc.  American Heart Association guidelines for healthy diet, basically Mediterranean diet, and exercise guidelines of 30 minutes 5 days per week or more discussed in detail.  Health counseling performed.  All questions answered.   5) Lifestyle & Preventative Health Maintenance - Advised patient to continue working toward exercising to improve overall mental, physical, and emotional health.    - Reviewed the "spokes of the wheel" of mood and health management.  Stressed the importance of ongoing prudent habits, including regular exercise, appropriate sleep hygiene, healthful dietary habits, and prayer/meditation to relax.  -  Encouraged patient to engage in daily physical activity, especially a formal exercise routine.  Recommended that the patient eventually strive for at least 150 minutes of moderate cardiovascular activity per week according to guidelines established by the [facility].   - Healthy dietary habits  encouraged, including low-carb, and high amounts of lean protein in diet.   - Patient should also consume adequate amounts of water.  - Novel Covid -19 counseling done; all questions were answered.   - Current CDC / federal and [place] guidelines reviewed with patient  - Reminded pt of extreme importance of social distancing; wearing a mask when out in public; insensate handwashing and cleaning of surfaces, avoiding unnecessary trips for shopping and avoiding ALL but emergency appts etc. - Told patient to be prepared, not scared; and be smart for the sake of others - Patient will call with any additional concerns   6) Recommendations - Will review full fasting lab work. - Return in 1 month to review lab abnormalities, re-address fatigue, and re-check blood pressure. - Patient agrees to check BP at home and bring BP log to next OV.   Orders Placed This Encounter  Procedures  . CBC with Differential/Platelet  . Comprehensive metabolic panel  . Hemoglobin A1c  . Lipid panel  . T3  . T4, free  . TSH  . VITAMIN D 25 Hydroxy (Vit-D Deficiency, Fractures)  . Ambulatory referral to Ophthalmology  . POCT UA - Microalbumin     Return in 4 weeks (on 08/07/2019) for review labs,reck fatigue, re-check BP; pt will bring BP/BS log.   Gross side effects, risk and benefits, and alternatives of medications discussed with patient.  Patient is aware that all medications have potential side effects and we are unable to predict every side effect or drug-drug interaction that may occur.  Expresses verbal understanding and consents to current therapy plan and treatment regimen.  F-up preventative CPE in 1 year- reminded pt again, this is in addition to any chronic care visits.   Please see orders placed and AVS handed out to patient at the end of our visit for further patient instructions/ counseling done pertaining to today's office visit.  This document serves as a record of services personally  performed by Mellody Dance, DO. It was created on her behalf by [name], a trained medical scribe. The creation of this record is based on the scribe's personal observations and the provider's statements to them.   This case required medical decision making of at least moderate complexity. The above documentation has been reviewed to be accurate and was completed by Marjory Sneddon, D.O.      Subjective:    Chief Complaint  Patient presents with  . Annual Exam    HPI: [name] is a 58 y.o. female who presents to [facility] today a yearly health maintenance exam.  Health Maintenance Summary Reviewed and updated, unless pt declines services.  Colonoscopy:  Last colonoscopy in 2017, UTD, repeat 10 years. Tobacco History Reviewed:  Y; never smoker. Alcohol:  No concerns, no excessive use. Exercise Habits:  Not meeting AHA guidelines STD concerns:  None. Drug Use:  None Birth control method:  S/p hysterectomy and oophorectomy. Menses regular:  S/p hysterectomy and oophorectomy. Lumps or breast concerns:  No.  Notes she does do self-breast exams. Last mammogram obtained February of 2020. Breast Cancer Family History:  No.  Has been doing intermittent fasting, stopped eating past 5 o'clock, and notes  that her sugars have dropped.  Thinks she has lost 20 lbs since March.  Notes she "can't eat anything, allergic to dairy, eggs, etc."  States she has been more tired, and "has to do 2-3 fifteen minute naps per day."  Says her sugars have been under control so she isn't sure why she's so tired.  Does experience some soreness near her site of incision from the hysterectomy.  Denies new chest pain, SOB, GI concerns, nausea, diarrhea, etc.  - Female Health Last pap smear in 2009. Hysterectomy of uterus and both ovaries.  - Eye Health Notes her last diabetic eye exam was not too long ago, but she is due.  - [place] to the  dentist more than every six months because she has gingivitis.  - Knees Notes she has a torn meniscus, "so torn that it's fallen down." Had a good experience with Dr. Wynelle Link when she last went.  Notes she wants to get a second opinion before proceeding with surgery.  - Dermatological Health She had melanoma; goes to dermatology regularly, follows up with [facility]. Thinks she sees the PA there, [place] Black.     Immunization History  Administered Date(s) Administered  . Influenza Split 08/22/2011, 10/12/2013  . Influenza Whole 07/01/2009  . Influenza,inj,Quad PF,6+ Mos 07/09/2016, 07/07/2018  . Pneumococcal Polysaccharide-23 10/12/2013  . Td 08/25/2007  . Tdap 10/12/2013  . Typhoid Live 11/01/2017    Health Maintenance  Topic Date Due  . Hepatitis C Screening  02-Jul-1961  . INFLUENZA VACCINE  04/18/2019  . OPHTHALMOLOGY EXAM  06/17/2019  . HEMOGLOBIN A1C  01/08/2020  . FOOT EXAM  07/09/2020  . MAMMOGRAM  11/10/2020  . TETANUS/TDAP  10/13/2023  . COLONOSCOPY  04/05/2026  . PNEUMOCOCCAL POLYSACCHARIDE VACCINE AGE 44-64 HIGH RISK  Completed  . HIV Screening  Completed  . PAP SMEAR-Modifier  Discontinued     Wt Readings from Last 3 Encounters:  07/10/19 215 lb 12.8 oz (97.9 kg)  04/27/19 230 lb 6.4 oz (104.5 kg)  03/31/19 230 lb 3.2 oz (104.4 kg)   BP Readings from Last 3 Encounters:  07/10/19 139/89  04/27/19 136/84  03/31/19 (!) 152/98   Pulse Readings from Last 3 Encounters:  07/10/19 75  04/27/19 84  03/31/19 88     Past Medical History:  Diagnosis Date  . Allergic rhinitis   . Allergy   . Anxiety   . Asthma   . Depression   . Diabetes mellitus ([place])   . GERD (gastroesophageal reflux disease)   . Hepatic steatosis 05/08/2018  . Hx of echocardiogram    a. Echo (7/15): Mild focal basal hypertrophy of the septum, EF 55-60%, normal wall motion, grade 1 diastolic dysfunction  . Hyperlipidemia   . Hypertension   . Hypothyroid   . Melanoma ([place]) 2015    back  . Seizure disorder ([place])   . Seizures ([place])    seizures caused by antidepressants; not seizure in 12 years  . Sialadenitis    seen by Dr Lucia Gaskins in the past  . Sleep apnea    lost 40 pounds; does not use cpap      Past Surgical History:  Procedure Laterality Date  . ABDOMINAL HYSTERECTOMY  1998  . BILE DUCT EXPLORATION  1995  . C-section x3    . CESAREAN SECTION    . CHOLECYSTECTOMY  1995  . LAPAROSCOPIC LYSIS INTESTINAL ADHESIONS  2001, 2000  . LAPAROSCOPIC OOPHERECTOMY  1999  . MELANOMA EXCISION  2015  back   . TUBAL LIGATION  1998      Family History  Problem Relation Age of Onset  . Alcohol abuse Other   . Autism Daughter        and vitamin K deficiency  . Coronary artery disease Father        MI in his 72s  . Alcohol abuse Father   . Heart disease Father   . Diabetes Father   . Hyperlipidemia Father   . Hypertension Father   . Diabetes Other        multiple family members  . Depression Mother   . Obesity Sister        "sibling"  . Colon cancer Neg Hx       Social History   Substance and Sexual Activity  Drug Use No  ,   Social History   Substance and Sexual Activity  Alcohol Use Yes  . Alcohol/week: 0.0 standard drinks   Comment: Rarely  ,   Social History   Tobacco Use  Smoking Status Never Smoker  Smokeless Tobacco Never Used  ,   Social History   Substance and Sexual Activity  Sexual Activity Not on file    Current Outpatient Medications on File Prior to Visit  Medication Sig Dispense Refill  . albuterol (PROVENTIL HFA;VENTOLIN HFA) 108 (90 Base) MCG/ACT inhaler Inhale 1 puff into the lungs every 6 (six) hours as needed for wheezing. 18 g 1  . aspirin 81 MG tablet Take 81 mg by mouth daily.    Marland Kitchen atorvastatin (LIPITOR) 20 MG tablet Take 1 tablet (20 mg total) by mouth daily. 90 tablet 1  . AUVI-Q 0.3 MG/0.3ML SOAJ injection Use for life threatening allergic reactions 4 Device 3  . Cholecalciferol (VITAMIN D) 2000 units  CAPS 1 tab p.o. daily 30 capsule   . empagliflozin (JARDIANCE) 25 MG TABS tablet Take 1 tablet by mouth daily.    . eszopiclone (LUNESTA) 2 MG TABS tablet TAKE 1 TABLET BY MOUTH AT  BEDTIME AS NEEDED FOR  SLEEP.TAKE IMMEDIATELY  BEFORE BEDTIME AS DIRECTED. 90 tablet 1  . FLOVENT HFA 220 MCG/ACT inhaler INHALE 1 PUFF BY MOUTH INTO THE LUNGS TWO TIMES DAILY 24 g 2  . glucose blood (ACCU-CHEK COMPACT PLUS) test strip Use to test BID - fasting in AM and 2 hours after largest meal 100 each 12  . JANUVIA 100 MG tablet TAKE 1 TABLET BY MOUTH  DAILY 90 tablet 3  . levothyroxine (SYNTHROID) 50 MCG tablet Take 1 tablet (50 mcg total) by mouth daily. 90 tablet 1  . loratadine (CLARITIN) 10 MG tablet Take 10 mg by mouth daily.    . metFORMIN (GLUCOPHAGE) 1000 MG tablet TAKE 1 TABLET BY MOUTH  TWICE DAILY WITH MEALS 180 tablet 3  . montelukast (SINGULAIR) 10 MG tablet TAKE 1 TABLET BY MOUTH AT  BEDTIME 90 tablet 1  . olmesartan (BENICAR) 5 MG tablet Take 1 tablet (5 mg total) by mouth daily. 90 tablet 1  . venlafaxine XR (EFFEXOR-XR) 75 MG 24 hr capsule TAKE 1 CAPSULE BY MOUTH  DAILY 90 capsule 1  . Vitamin D, Ergocalciferol, (DRISDOL) 1.25 MG (50000 UT) CAPS capsule Take 1 capsule (50,000 Units total) by mouth every 7 (seven) days. 12 capsule 3  . levocetirizine (XYZAL) 5 MG tablet Take 1 tablet (5 mg total) by mouth every evening. 30 tablet 5   No current facility-administered medications on file prior to visit.     Allergies: Lac bovis, Dulaglutide,  Eggs or egg-derived products, Lexapro [escitalopram], Morphine, and Morphine sulfate  Review of Systems: General:   Denies fever, chills, unexplained weight loss.  Optho/Auditory:   Denies visual changes, blurred vision/LOV Respiratory:   Denies SOB, DOE more than baseline levels.   Cardiovascular:   Denies chest pain, palpitations, new onset peripheral edema  Gastrointestinal:   Denies nausea, vomiting, diarrhea.  Genitourinary: Denies dysuria, freq/  urgency, flank pain or discharge from genitals.  Endocrine:     Denies hot or cold intolerance, polyuria, polydipsia. Musculoskeletal:   Denies unexplained myalgias, joint swelling, unexplained arthralgias, gait problems.  Skin:  Denies rash, suspicious lesions Neurological:     Denies dizziness, unexplained weakness, numbness  Psychiatric/Behavioral:   Denies mood changes, suicidal or homicidal ideations, hallucinations    Objective:    Blood pressure 139/89, pulse 75, temperature 98.7 F (37.1 C), resp. rate 16, height 5' 3.5" (1.613 m), weight 215 lb 12.8 oz (97.9 kg), SpO2 97 %. Body mass index is 37.63 kg/m. General Appearance:    Alert, cooperative, no distress, appears stated age  Head:    Normocephalic, without obvious abnormality, atraumatic  Eyes:    PERRL, conjunctiva/corneas clear, EOM's intact, fundi    benign, both eyes  Ears:    Normal TM's and external ear canals, both ears  Nose:   Nares normal, septum midline, mucosa normal, no drainage    or sinus tenderness  Throat:   Lips w/o lesion, mucosa moist, and tongue normal; teeth and   gums normal  Neck:   Supple, symmetrical, trachea midline, no adenopathy;    thyroid:  no enlargement/tenderness/nodules; no carotid   bruit or JVD  Back:     Symmetric, no curvature, ROM normal, no CVA tenderness  Lungs:     Clear to auscultation bilaterally, respirations unlabored, no       Wh/ R/ R  Chest Wall:    No tenderness or gross deformity; normal excursion   Heart:    Regular rate and rhythm, S1 and S2 normal, no murmur, rub   or gallop  Breast Exam:    No tenderness, masses, or nipple abnormality b/l; no d/c  Abdomen:     Soft, non-tender, bowel sounds active all four quadrants, NO   G/R/R, no masses, no organomegaly  Genitalia:    Deferred.  Rectal:    Deferred.  Extremities:   Extremities normal, atraumatic, no cyanosis or gross edema  Pulses:   2+ and symmetric all extremities  Skin:   Warm, dry, Skin color, texture,  turgor normal, no obvious rashes or lesions Psych: No HI/SI, judgement and insight good, Euthymic mood. Full Affect.  Neurologic:   CNII-XII intact, normal strength, sensation and reflexes    Throughout

## 2019-07-10 NOTE — Patient Instructions (Signed)
-Have Bp and BS logs for next OV in 1 month.  We will go over labs, also address fatigue if labs do not indicate anything specific and also go over blood pressure blood sugar.   Preventive Care for Adults, Female  A healthy lifestyle and preventive care can promote health and wellness. Preventive health guidelines for women include the following key practices.   A routine yearly physical is a good way to check with your health care provider about your health and preventive screening. It is a chance to share any concerns and updates on your health and to receive a thorough exam.   Visit your dentist for a routine exam and preventive care every 6 months. Brush your teeth twice a day and floss once a day. Good oral hygiene prevents tooth decay and gum disease.   The frequency of eye exams is based on your age, health, family medical history, use of contact lenses, and other factors. Follow your health care provider's recommendations for frequency of eye exams.   Eat a healthy diet. Foods like vegetables, fruits, whole grains, low-fat dairy products, and lean protein foods contain the nutrients you need without too many calories. Decrease your intake of foods high in solid fats, added sugars, and salt. Eat the right amount of calories for you.Get information about a proper diet from your health care provider, if necessary.   Regular physical exercise is one of the most important things you can do for your health. Most adults should get at least 150 minutes of moderate-intensity exercise (any activity that increases your heart rate and causes you to sweat) each week. In addition, most adults need muscle-strengthening exercises on 2 or more days a week.   Maintain a healthy weight. The body mass index (BMI) is a screening tool to identify possible weight problems. It provides an estimate of body fat based on height and weight. Your health care provider can find your BMI, and can help you achieve or  maintain a healthy weight.For adults 20 years and older:   - A BMI below 18.5 is considered underweight.   - A BMI of 18.5 to 24.9 is normal.   - A BMI of 25 to 29.9 is considered overweight.   - A BMI of 30 and above is considered obese.   Maintain normal blood lipids and cholesterol levels by exercising and minimizing your intake of trans and saturated fats.  Eat a balanced diet with plenty of fruit and vegetables. Blood tests for lipids and cholesterol should begin at age 24 and be repeated every 5 years minimum.  If your lipid or cholesterol levels are high, you are over 40, or you are at high risk for heart disease, you may need your cholesterol levels checked more frequently.Ongoing high lipid and cholesterol levels should be treated with medicines if diet and exercise are not working.   If you smoke, find out from your health care provider how to quit. If you do not use tobacco, do not start.   Lung cancer screening is recommended for adults aged 33-80 years who are at high risk for developing lung cancer because of a history of smoking. A yearly low-dose CT scan of the lungs is recommended for people who have at least a 30-pack-year history of smoking and are a current smoker or have quit within the past 15 years. A pack year of smoking is smoking an average of 1 pack of cigarettes a day for 1 year (for example: 1 pack a  day for 30 years or 2 packs a day for 15 years). Yearly screening should continue until the smoker has stopped smoking for at least 15 years. Yearly screening should be stopped for people who develop a health problem that would prevent them from having lung cancer treatment.   If you are pregnant, do not drink alcohol. If you are breastfeeding, be very cautious about drinking alcohol. If you are not pregnant and choose to drink alcohol, do not have more than 1 drink per day. One drink is considered to be 12 ounces (355 mL) of beer, 5 ounces (148 mL) of wine, or 1.5 ounces  (44 mL) of liquor.   Avoid use of street drugs. Do not share needles with anyone. Ask for help if you need support or instructions about stopping the use of drugs.   High blood pressure causes heart disease and increases the risk of stroke. Your blood pressure should be checked at least yearly.  Ongoing high blood pressure should be treated with medicines if weight loss and exercise do not work.   If you are 52-78 years old, ask your health care provider if you should take aspirin to prevent strokes.   Diabetes screening involves taking a blood sample to check your fasting blood sugar level. This should be done once every 3 years, after age 82, if you are within normal weight and without risk factors for diabetes. Testing should be considered at a younger age or be carried out more frequently if you are overweight and have at least 1 risk factor for diabetes.   Breast cancer screening is essential preventive care for women. You should practice "breast self-awareness."  This means understanding the normal appearance and feel of your breasts and may include breast self-examination.  Any changes detected, no matter how small, should be reported to a health care provider.  Women in their 75s and 30s should have a clinical breast exam (CBE) by a health care provider as part of a regular health exam every 1 to 3 years.  After age 70, women should have a CBE every year.  Starting at age 8, women should consider having a mammogram (breast X-ray test) every year.  Women who have a family history of breast cancer should talk to their health care provider about genetic screening.  Women at a high risk of breast cancer should talk to their health care providers about having an MRI and a mammogram every year.   -Breast cancer gene (BRCA)-related cancer risk assessment is recommended for women who have family members with BRCA-related cancers. BRCA-related cancers include breast, ovarian, tubal, and peritoneal  cancers. Having family members with these cancers may be associated with an increased risk for harmful changes (mutations) in the breast cancer genes BRCA1 and BRCA2. Results of the assessment will determine the need for genetic counseling and BRCA1 and BRCA2 testing.   The Pap test is a screening test for cervical cancer. A Pap test can show cell changes on the cervix that might become cervical cancer if left untreated. A Pap test is a procedure in which cells are obtained and examined from the lower end of the uterus (cervix).   - Women should have a Pap test starting at age 22.   - Between ages 30 and 65, Pap tests should be repeated every 2 years.   - Beginning at age 30, you should have a Pap test every 3 years as long as the past 3 Pap tests have been normal.   -  Some women have medical problems that increase the chance of getting cervical cancer. Talk to your health care provider about these problems. It is especially important to talk to your health care provider if a new problem develops soon after your last Pap test. In these cases, your health care provider may recommend more frequent screening and Pap tests.   - The above recommendations are the same for women who have or have not gotten the vaccine for human papillomavirus (HPV).   - If you had a hysterectomy for a problem that was not cancer or a condition that could lead to cancer, then you no longer need Pap tests. Even if you no longer need a Pap test, a regular exam is a good idea to make sure no other problems are starting.   - If you are between ages 6 and 26 years, and you have had normal Pap tests going back 10 years, you no longer need Pap tests. Even if you no longer need a Pap test, a regular exam is a good idea to make sure no other problems are starting.   - If you have had past treatment for cervical cancer or a condition that could lead to cancer, you need Pap tests and screening for cancer for at least 20 years after  your treatment.   - If Pap tests have been discontinued, risk factors (such as a new sexual partner) need to be reassessed to determine if screening should be resumed.   - The HPV test is an additional test that may be used for cervical cancer screening. The HPV test looks for the virus that can cause the cell changes on the cervix. The cells collected during the Pap test can be tested for HPV. The HPV test could be used to screen women aged 34 years and older, and should be used in women of any age who have unclear Pap test results. After the age of 48, women should have HPV testing at the same frequency as a Pap test.   Colorectal cancer can be detected and often prevented. Most routine colorectal cancer screening begins at the age of 43 years and continues through age 37 years. However, your health care provider may recommend screening at an earlier age if you have risk factors for colon cancer. On a yearly basis, your health care provider may provide home test kits to check for hidden blood in the stool.  Use of a small camera at the end of a tube, to directly examine the colon (sigmoidoscopy or colonoscopy), can detect the earliest forms of colorectal cancer. Talk to your health care provider about this at age 39, when routine screening begins. Direct exam of the colon should be repeated every 5 -10 years through age 4 years, unless early forms of pre-cancerous polyps or small growths are found.   People who are at an increased risk for hepatitis B should be screened for this virus. You are considered at high risk for hepatitis B if:  -You were born in a country where hepatitis B occurs often. Talk with your health care provider about which countries are considered high risk.  - Your parents were born in a high-risk country and you have not received a shot to protect against hepatitis B (hepatitis B vaccine).  - You have HIV or AIDS.  - You use needles to inject street drugs.  - You live with,  or have sex with, someone who has Hepatitis B.  - You get hemodialysis  treatment.  - You take certain medicines for conditions like cancer, organ transplantation, and autoimmune conditions.   Hepatitis C blood testing is recommended for all people born from [birth year] through 1965 and any individual with known risks for hepatitis C.   Practice safe sex. Use condoms and avoid high-risk sexual practices to reduce the spread of sexually transmitted infections (STIs). STIs include gonorrhea, chlamydia, syphilis, trichomonas, herpes, HPV, and human immunodeficiency virus (HIV). Herpes, HIV, and HPV are viral illnesses that have no cure. They can result in disability, cancer, and death. Sexually active women aged 58 years and younger should be checked for chlamydia. Older women with new or multiple partners should also be tested for chlamydia. Testing for other STIs is recommended if you are sexually active and at increased risk.   Osteoporosis is a disease in which the bones lose minerals and strength with aging. This can result in serious bone fractures or breaks. The risk of osteoporosis can be identified using a bone density scan. Women ages 59 years and over and women at risk for fractures or osteoporosis should discuss screening with their health care providers. Ask your health care provider whether you should take a calcium supplement or vitamin D to There are also several preventive steps women can take to avoid osteoporosis and resulting fractures or to keep osteoporosis from worsening. -->Recommendations include:  Eat a balanced diet high in fruits, vegetables, calcium, and vitamins.  Get enough calcium. The recommended total intake of is 1,200 mg daily; for best absorption, if taking supplements, divide doses into 250-500 mg doses throughout the day. Of the two types of calcium, calcium carbonate is best absorbed when taken with food but calcium citrate can be taken on an empty stomach.  Get enough  vitamin D. [employer] and the [place] recommend at least 1,000 IU per day for women age 53 and over who are at risk of vitamin D deficiency. Vitamin D deficiency can be caused by inadequate sun exposure (for example, those who live in [place]).  Avoid alcohol and smoking. Heavy alcohol intake (more than 7 drinks per week) increases the risk of falls and hip fracture and women smokers tend to lose bone more rapidly and have lower bone mass than nonsmokers. Stopping smoking is one of the most important changes women can make to improve their health and decrease risk for disease.  Be physically active every day. Weight-bearing exercise (for example, fast walking, hiking, jogging, and weight training) may strengthen bones or slow the rate of bone loss that comes with aging. Balancing and muscle-strengthening exercises can reduce the risk of falling and fracture.  Consider therapeutic medications. Currently, several types of effective drugs are available. Healthcare providers can recommend the type most appropriate for each woman.  Eliminate environmental factors that may contribute to accidents. Falls cause nearly 90% of all osteoporotic fractures, so reducing this risk is an important bone-health strategy. Measures include ample lighting, removing obstructions to walking, using nonskid rugs on floors, and placing mats and/or grab bars in showers.  Be aware of medication side effects. Some common medicines make bones weaker. These include a type of steroid drug called glucocorticoids used for arthritis and asthma, some antiseizure drugs, certain sleeping pills, treatments for endometriosis, and some cancer drugs. An overactive thyroid gland or using too much thyroid hormone for an underactive thyroid can also be a problem. If you are taking these medicines, talk to your doctor about what you can do to help protect your bones.reduce  the rate of osteoporosis.    Menopause can be  associated with physical symptoms and risks. Hormone replacement therapy is available to decrease symptoms and risks. You should talk to your health care provider about whether hormone replacement therapy is right for you.   Use sunscreen. Apply sunscreen liberally and repeatedly throughout the day. You should seek shade when your shadow is shorter than you. Protect yourself by wearing long sleeves, pants, a wide-brimmed hat, and sunglasses year round, whenever you are outdoors.   Once a month, do a whole body skin exam, using a mirror to look at the skin on your back. Tell your health care provider of new moles, moles that have irregular borders, moles that are larger than a pencil eraser, or moles that have changed in shape or color.   -Stay current with required vaccines (immunizations).   Influenza vaccine. All adults should be immunized every year.  Tetanus, diphtheria, and acellular pertussis (Td, Tdap) vaccine. Pregnant women should receive 1 dose of Tdap vaccine during each pregnancy. The dose should be obtained regardless of the length of time since the last dose. Immunization is preferred during the 27th 36th week of gestation. An adult who has not previously received Tdap or who does not know her vaccine status should receive 1 dose of Tdap. This initial dose should be followed by tetanus and diphtheria toxoids (Td) booster doses every 10 years. Adults with an unknown or incomplete history of completing a 3-dose immunization series with Td-containing vaccines should begin or complete a primary immunization series including a Tdap dose. Adults should receive a Td booster every 10 years.  Varicella vaccine. An adult without evidence of immunity to varicella should receive 2 doses or a second dose if she has previously received 1 dose. Pregnant females who do not have evidence of immunity should receive the first dose after pregnancy. This first dose should be obtained before leaving the  health care facility. The second dose should be obtained 4 8 weeks after the first dose.  Human papillomavirus (HPV) vaccine. Females aged 38 26 years who have not received the vaccine previously should obtain the 3-dose series. The vaccine is not recommended for use in pregnant females. However, pregnancy testing is not needed before receiving a dose. If a female is found to be pregnant after receiving a dose, no treatment is needed. In that case, the remaining doses should be delayed until after the pregnancy. Immunization is recommended for any person with an immunocompromised condition through the age of 84 years if she did not get any or all doses earlier. During the 3-dose series, the second dose should be obtained 4 8 weeks after the first dose. The third dose should be obtained 24 weeks after the first dose and 16 weeks after the second dose.  Zoster vaccine. One dose is recommended for adults aged 57 years or older unless certain conditions are present.  Measles, mumps, and rubella (MMR) vaccine. Adults born before 50 generally are considered immune to measles and mumps. Adults born in [birth year] or later should have 1 or more doses of MMR vaccine unless there is a contraindication to the vaccine or there is laboratory evidence of immunity to each of the three diseases. A routine second dose of MMR vaccine should be obtained at least 28 days after the first dose for students attending postsecondary schools, health care workers, or international travelers. People who received inactivated measles vaccine or an unknown type of measles vaccine during 1963 1967 should receive  2 doses of MMR vaccine. People who received inactivated mumps vaccine or an unknown type of mumps vaccine before 1979 and are at high risk for mumps infection should consider immunization with 2 doses of MMR vaccine. For females of childbearing age, rubella immunity should be determined. If there is no evidence of immunity, females who  are not pregnant should be vaccinated. If there is no evidence of immunity, females who are pregnant should delay immunization until after pregnancy. Unvaccinated health care workers born before [birth year] who lack laboratory evidence of measles, mumps, or rubella immunity or laboratory confirmation of disease should consider measles and mumps immunization with 2 doses of MMR vaccine or rubella immunization with 1 dose of MMR vaccine.  Pneumococcal 13-valent conjugate (PCV13) vaccine. When indicated, a person who is uncertain of her immunization history and has no record of immunization should receive the PCV13 vaccine. An adult aged 62 years or older who has certain medical conditions and has not been previously immunized should receive 1 dose of PCV13 vaccine. This PCV13 should be followed with a dose of pneumococcal polysaccharide (PPSV23) vaccine. The PPSV23 vaccine dose should be obtained at least 8 weeks after the dose of PCV13 vaccine. An adult aged 70 years or older who has certain medical conditions and previously received 1 or more doses of PPSV23 vaccine should receive 1 dose of PCV13. The PCV13 vaccine dose should be obtained 1 or more years after the last PPSV23 vaccine dose.  Pneumococcal polysaccharide (PPSV23) vaccine. When PCV13 is also indicated, PCV13 should be obtained first. All adults aged 72 years and older should be immunized. An adult younger than age 49 years who has certain medical conditions should be immunized. Any person who resides in a nursing home or long-term care facility should be immunized. An adult smoker should be immunized. People with an immunocompromised condition and certain other conditions should receive both PCV13 and PPSV23 vaccines. People with human immunodeficiency virus (HIV) infection should be immunized as soon as possible after diagnosis. Immunization during chemotherapy or radiation therapy should be avoided. Routine use of PPSV23 vaccine is not recommended for  American Indians, [place] Natives, or people younger than 65 years unless there are medical conditions that require PPSV23 vaccine. When indicated, people who have unknown immunization and have no record of immunization should receive PPSV23 vaccine. One-time revaccination 5 years after the first dose of PPSV23 is recommended for people aged 50 64 years who have chronic kidney failure, nephrotic syndrome, asplenia, or immunocompromised conditions. People who received 1 2 doses of PPSV23 before age 68 years should receive another dose of PPSV23 vaccine at age 53 years or later if at least 5 years have passed since the previous dose. Doses of PPSV23 are not needed for people immunized with PPSV23 at or after age 14 years.  Meningococcal vaccine. Adults with asplenia or persistent complement component deficiencies should receive 2 doses of quadrivalent meningococcal conjugate (MenACWY-D) vaccine. The doses should be obtained at least 2 months apart. Microbiologists working with certain meningococcal bacteria, [employer] recruits, people at risk during an outbreak, and people who travel to or live in countries with a high rate of meningitis should be immunized. A first-year college student up through age 45 years who is living in a residence hall should receive a dose if she did not receive a dose on or after her 16th birthday. Adults who have certain high-risk conditions should receive one or more doses of vaccine.  Hepatitis A vaccine. Adults who wish to be  protected from this disease, have certain high-risk conditions, work with hepatitis A-infected animals, work in hepatitis A research labs, or travel to or work in countries with a high rate of hepatitis A should be immunized. Adults who were previously unvaccinated and who anticipate close contact with an international adoptee during the first 60 days after arrival in the [place] from a country with a high rate of hepatitis A should be  immunized.  Hepatitis B vaccine.  Adults who wish to be protected from this disease, have certain high-risk conditions, may be exposed to blood or other infectious body fluids, are household contacts or sex partners of hepatitis B positive people, are clients or workers in certain care facilities, or travel to or work in countries with a high rate of hepatitis B should be immunized.  Haemophilus influenzae type b (Hib) vaccine. A previously unvaccinated person with asplenia or sickle cell disease or having a scheduled splenectomy should receive 1 dose of Hib vaccine. Regardless of previous immunization, a recipient of a hematopoietic stem cell transplant should receive a 3-dose series 6 12 months after her successful transplant. Hib vaccine is not recommended for adults with HIV infection.  Preventive Services / Frequency Ages 39 to 39years  Blood pressure check.** / Every 1 to 2 years.  Lipid and cholesterol check.** / Every 5 years beginning at age 68.  Clinical breast exam.** / Every 3 years for women in their 85s and 38s.  BRCA-related cancer risk assessment.** / For women who have family members with a BRCA-related cancer (breast, ovarian, tubal, or peritoneal cancers).  Pap test.** / Every 2 years from ages 41 through 84. Every 3 years starting at age 46 through age 37 or 7 with a history of 3 consecutive normal Pap tests.  HPV screening.** / Every 3 years from ages 13 through ages 15 to 20 with a history of 3 consecutive normal Pap tests.  Hepatitis C blood test.** / For any individual with known risks for hepatitis C.  Skin self-exam. / Monthly.  Influenza vaccine. / Every year.  Tetanus, diphtheria, and acellular pertussis (Tdap, Td) vaccine.** / Consult your health care provider. Pregnant women should receive 1 dose of Tdap vaccine during each pregnancy. 1 dose of Td every 10 years.  Varicella vaccine.** / Consult your health care provider. Pregnant females who do not have  evidence of immunity should receive the first dose after pregnancy.  HPV vaccine. / 3 doses over 6 months, if 81 and younger. The vaccine is not recommended for use in pregnant females. However, pregnancy testing is not needed before receiving a dose.  Measles, mumps, rubella (MMR) vaccine.** / You need at least 1 dose of MMR if you were born in 1957 or later. You may also need a 2nd dose. For females of childbearing age, rubella immunity should be determined. If there is no evidence of immunity, females who are not pregnant should be vaccinated. If there is no evidence of immunity, females who are pregnant should delay immunization until after pregnancy.  Pneumococcal 13-valent conjugate (PCV13) vaccine.** / Consult your health care provider.  Pneumococcal polysaccharide (PPSV23) vaccine.** / 1 to 2 doses if you smoke cigarettes or if you have certain conditions.  Meningococcal vaccine.** / 1 dose if you are age 29 to 87 years and a Market researcher living in a residence hall, or have one of several medical conditions, you need to get vaccinated against meningococcal disease. You may also need additional booster doses.  Hepatitis A  vaccine.** / Consult your health care provider.  Hepatitis B vaccine.** / Consult your health care provider.  Haemophilus influenzae type b (Hib) vaccine.** / Consult your health care provider.  Ages 48 to 64years  Blood pressure check.** / Every 1 to 2 years.  Lipid and cholesterol check.** / Every 5 years beginning at age 29 years.  Lung cancer screening. / Every year if you are aged 61 80 years and have a 30-pack-year history of smoking and currently smoke or have quit within the past 15 years. Yearly screening is stopped once you have quit smoking for at least 15 years or develop a health problem that would prevent you from having lung cancer treatment.  Clinical breast exam.** / Every year after age 29 years.  BRCA-related cancer risk  assessment.** / For women who have family members with a BRCA-related cancer (breast, ovarian, tubal, or peritoneal cancers).  Mammogram.** / Every year beginning at age 75 years and continuing for as long as you are in good health. Consult with your health care provider.  Pap test.** / Every 3 years starting at age 31 years through age 18 or 8 years with a history of 3 consecutive normal Pap tests.  HPV screening.** / Every 3 years from ages 43 years through ages 39 to 58 years with a history of 3 consecutive normal Pap tests.  Fecal occult blood test (FOBT) of stool. / Every year beginning at age 68 years and continuing until age 70 years. You may not need to do this test if you get a colonoscopy every 10 years.  Flexible sigmoidoscopy or colonoscopy.** / Every 5 years for a flexible sigmoidoscopy or every 10 years for a colonoscopy beginning at age 71 years and continuing until age 38 years.  Hepatitis C blood test.** / For all people born from [birth year] through 1963 and any individual with known risks for hepatitis C.  Skin self-exam. / Monthly.  Influenza vaccine. / Every year.  Tetanus, diphtheria, and acellular pertussis (Tdap/Td) vaccine.** / Consult your health care provider. Pregnant women should receive 1 dose of Tdap vaccine during each pregnancy. 1 dose of Td every 10 years.  Varicella vaccine.** / Consult your health care provider. Pregnant females who do not have evidence of immunity should receive the first dose after pregnancy.  Zoster vaccine.** / 1 dose for adults aged 52 years or older.  Measles, mumps, rubella (MMR) vaccine.** / You need at least 1 dose of MMR if you were born in 1957 or later. You may also need a 2nd dose. For females of childbearing age, rubella immunity should be determined. If there is no evidence of immunity, females who are not pregnant should be vaccinated. If there is no evidence of immunity, females who are pregnant should delay immunization until  after pregnancy.  Pneumococcal 13-valent conjugate (PCV13) vaccine.** / Consult your health care provider.  Pneumococcal polysaccharide (PPSV23) vaccine.** / 1 to 2 doses if you smoke cigarettes or if you have certain conditions.  Meningococcal vaccine.** / Consult your health care provider.  Hepatitis A vaccine.** / Consult your health care provider.  Hepatitis B vaccine.** / Consult your health care provider.  Haemophilus influenzae type b (Hib) vaccine.** / Consult your health care provider.  Ages 67 years and over  Blood pressure check.** / Every 1 to 2 years.  Lipid and cholesterol check.** / Every 5 years beginning at age 67 years.  Lung cancer screening. / Every year if you are aged 43 80 years and have  a 30-pack-year history of smoking and currently smoke or have quit within the past 15 years. Yearly screening is stopped once you have quit smoking for at least 15 years or develop a health problem that would prevent you from having lung cancer treatment.  Clinical breast exam.** / Every year after age 37 years.  BRCA-related cancer risk assessment.** / For women who have family members with a BRCA-related cancer (breast, ovarian, tubal, or peritoneal cancers).  Mammogram.** / Every year beginning at age 48 years and continuing for as long as you are in good health. Consult with your health care provider.  Pap test.** / Every 3 years starting at age 74 years through age 37 or 45 years with 3 consecutive normal Pap tests. Testing can be stopped between 65 and 70 years with 3 consecutive normal Pap tests and no abnormal Pap or HPV tests in the past 10 years.  HPV screening.** / Every 3 years from ages 53 years through ages 49 or 65 years with a history of 3 consecutive normal Pap tests. Testing can be stopped between 65 and 70 years with 3 consecutive normal Pap tests and no abnormal Pap or HPV tests in the past 10 years.  Fecal occult blood test (FOBT) of stool. / Every year  beginning at age 75 years and continuing until age 18 years. You may not need to do this test if you get a colonoscopy every 10 years.  Flexible sigmoidoscopy or colonoscopy.** / Every 5 years for a flexible sigmoidoscopy or every 10 years for a colonoscopy beginning at age 6 years and continuing until age 60 years.  Hepatitis C blood test.** / For all people born from [birth year] through 1963 and any individual with known risks for hepatitis C.  Osteoporosis screening.** / A one-time screening for women ages 59 years and over and women at risk for fractures or osteoporosis.  Skin self-exam. / Monthly.  Influenza vaccine. / Every year.  Tetanus, diphtheria, and acellular pertussis (Tdap/Td) vaccine.** / 1 dose of Td every 10 years.  Varicella vaccine.** / Consult your health care provider.  Zoster vaccine.** / 1 dose for adults aged 23 years or older.  Pneumococcal 13-valent conjugate (PCV13) vaccine.** / Consult your health care provider.  Pneumococcal polysaccharide (PPSV23) vaccine.** / 1 dose for all adults aged 74 years and older.  Meningococcal vaccine.** / Consult your health care provider.  Hepatitis A vaccine.** / Consult your health care provider.  Hepatitis B vaccine.** / Consult your health care provider.  Haemophilus influenzae type b (Hib) vaccine.** / Consult your health care provider. ** Family history and personal history of risk and conditions may change your health care provider's recommendations. Document Released: 10/30/2001 Document Revised: 06/24/2013  [facility] Patient Information 2014 [place], [place].   EXERCISE AND DIET:  We recommended that you start or continue a regular exercise program for good health. Regular exercise means any activity that makes your heart beat faster and makes you sweat.  We recommend exercising at least 30 minutes per day at least 3 days a week, preferably 5.  We also recommend a diet low in fat and sugar / carbohydrates.  Inactivity,  poor dietary choices and obesity can cause diabetes, heart attack, stroke, and kidney damage, among others.     ALCOHOL AND SMOKING:  Women should limit their alcohol intake to no more than 7 drinks/beers/glasses of wine (combined, not each!) per week. Moderation of alcohol intake to this level decreases your risk of breast cancer and liver damage.  (  And of course, no recreational drugs are part of a healthy lifestyle.)  Also, you should not be smoking at all or even being exposed to second hand smoke. Most people know smoking can cause cancer, and various heart and lung diseases, but did you know it also contributes to weakening of your bones?  Aging of your skin?  Yellowing of your teeth and nails?   CALCIUM AND VITAMIN D:  Adequate intake of calcium and Vitamin D are recommended.  The recommendations for exact amounts of these supplements seem to change often, but generally speaking 600 mg of calcium (either carbonate or citrate) and 800 units of Vitamin D per day seems prudent. Certain women may benefit from higher intake of Vitamin D.  If you are among these women, your doctor will have told you during your visit.     PAP SMEARS:  Pap smears, to check for cervical cancer or precancers,  have traditionally been done yearly, although recent scientific advances have shown that most women can have pap smears less often.  However, every woman still should have a physical exam from her gynecologist or primary care physician every year. It will include a breast check, inspection of the vulva and vagina to check for abnormal growths or skin changes, a visual exam of the cervix, and then an exam to evaluate the size and shape of the uterus and ovaries.  And after 56 years of age, a rectal exam is indicated to check for rectal cancers. We will also provide age appropriate advice regarding health maintenance, like when you should have certain vaccines, screening for sexually transmitted diseases, bone density  testing, colonoscopy, mammograms, etc.    MAMMOGRAMS:  All women over 29 years old should have a yearly mammogram. Many facilities now offer a "3D" mammogram, which may cost around $50 extra out of pocket. If possible,  we recommend you accept the option to have the 3D mammogram performed.  It both reduces the number of women who will be called back for extra views which then turn out to be normal, and it is better than the routine mammogram at detecting truly abnormal areas.     COLONOSCOPY:  Colonoscopy to screen for colon cancer is recommended for all women at age 9.  We know, you hate the idea of the prep.  We agree, BUT, having colon cancer and not knowing it is worse!!  Colon cancer so often starts as a polyp that can be seen and removed at colonscopy, which can quite literally save your life!  And if your first colonoscopy is normal and you have no family history of colon cancer, most women don't have to have it again for 10 years.  Once every ten years, you can do something that may end up saving your life, right?  We will be happy to help you get it scheduled when you are ready.  Be sure to check your insurance coverage so you understand how much it will cost.  It may be covered as a preventative service at no cost, but you should check your particular policy.

## 2019-07-11 LAB — COMPREHENSIVE METABOLIC PANEL
ALT: 42 IU/L — ABNORMAL HIGH (ref 0–32)
AST: 25 IU/L (ref 0–40)
Albumin/Globulin Ratio: 1.7 (ref 1.2–2.2)
Albumin: 4.5 g/dL (ref 3.8–4.9)
Alkaline Phosphatase: 82 IU/L (ref 39–117)
BUN/Creatinine Ratio: 9 (ref 9–23)
BUN: 10 mg/dL (ref 6–24)
Bilirubin Total: 0.4 mg/dL (ref 0.0–1.2)
CO2: 26 mmol/L (ref 20–29)
Calcium: 10.1 mg/dL (ref 8.7–10.2)
Chloride: 102 mmol/L (ref 96–106)
Creatinine, Ser: 1.13 mg/dL — ABNORMAL HIGH (ref 0.57–1.00)
GFR calc Af Amer: 63 mL/min/{1.73_m2} (ref >59)
GFR calc non Af Amer: 55 mL/min/{1.73_m2} — ABNORMAL LOW (ref >59)
Globulin, Total: 2.6 g/dL (ref 1.5–4.5)
Glucose: 118 mg/dL — ABNORMAL HIGH (ref 65–99)
Potassium: 4.4 mmol/L (ref 3.5–5.2)
Sodium: 141 mmol/L (ref 134–144)
Total Protein: 7.1 g/dL (ref 6.0–8.5)

## 2019-07-11 LAB — CBC WITH DIFFERENTIAL/PLATELET
Basophils Absolute: 0.1 10*3/uL (ref 0.0–0.2)
Basos: 1 %
EOS (ABSOLUTE): 0.5 10*3/uL — ABNORMAL HIGH (ref 0.0–0.4)
Eos: 6 %
Hematocrit: 40.7 % (ref 34.0–46.6)
Hemoglobin: 13.1 g/dL (ref 11.1–15.9)
Immature Grans (Abs): 0 10*3/uL (ref 0.0–0.1)
Immature Granulocytes: 0 %
Lymphocytes Absolute: 3 10*3/uL (ref 0.7–3.1)
Lymphs: 36 %
MCH: 29.5 pg (ref 26.6–33.0)
MCHC: 32.2 g/dL (ref 31.5–35.7)
MCV: 92 fL (ref 79–97)
Monocytes Absolute: 0.4 10*3/uL (ref 0.1–0.9)
Monocytes: 5 %
Neutrophils Absolute: 4.3 10*3/uL (ref 1.4–7.0)
Neutrophils: 52 %
Platelets: 332 10*3/uL (ref 150–450)
RBC: 4.44 x10E6/uL (ref 3.77–5.28)
RDW: 12.3 % (ref 11.7–15.4)
WBC: 8.3 10*3/uL (ref 3.4–10.8)

## 2019-07-11 LAB — HEMOGLOBIN A1C
Est. average glucose Bld gHb Est-mCnc: 140 mg/dL
Hgb A1c MFr Bld: 6.5 % — ABNORMAL HIGH (ref 4.8–5.6)

## 2019-07-11 LAB — T3: T3, Total: 100 ng/dL (ref 71–180)

## 2019-07-11 LAB — VITAMIN D 25 HYDROXY (VIT D DEFICIENCY, FRACTURES): Vit D, 25-Hydroxy: 50 ng/mL (ref 30.0–100.0)

## 2019-07-11 LAB — LIPID PANEL
Chol/HDL Ratio: 2.8 ratio (ref 0.0–4.4)
Cholesterol, Total: 134 mg/dL (ref 100–199)
HDL: 48 mg/dL (ref >39)
LDL Chol Calc (NIH): 59 mg/dL (ref 0–99)
Triglycerides: 163 mg/dL — ABNORMAL HIGH (ref 0–149)
VLDL Cholesterol Cal: 27 mg/dL (ref 5–40)

## 2019-07-11 LAB — T4, FREE: Free T4: 1.32 ng/dL (ref 0.82–1.77)

## 2019-07-11 LAB — TSH: TSH: 2.77 u[IU]/mL (ref 0.450–4.500)

## 2019-07-17 ENCOUNTER — Other Ambulatory Visit: Payer: Self-pay

## 2019-07-17 ENCOUNTER — Ambulatory Visit (INDEPENDENT_AMBULATORY_CARE_PROVIDER_SITE_OTHER): Payer: 59

## 2019-07-17 DIAGNOSIS — Z23 Encounter for immunization: Secondary | ICD-10-CM

## 2019-07-17 NOTE — Progress Notes (Signed)
Pt here for influenza vaccine.  Screening questionnaire reviewed, VIS provided to patient, and any/all patient questions answered.  Pt given egg-free formulation due to allergy to eggs.  Charyl Bigger, CMA

## 2019-07-26 ENCOUNTER — Encounter: Payer: Self-pay | Admitting: Family Medicine

## 2019-08-03 ENCOUNTER — Other Ambulatory Visit: Payer: Self-pay | Admitting: Family Medicine

## 2019-08-03 ENCOUNTER — Other Ambulatory Visit: Payer: Self-pay | Admitting: Allergy & Immunology

## 2019-08-03 DIAGNOSIS — F39 Unspecified mood [affective] disorder: Secondary | ICD-10-CM

## 2019-08-03 DIAGNOSIS — E1159 Type 2 diabetes mellitus with other circulatory complications: Secondary | ICD-10-CM

## 2019-08-03 DIAGNOSIS — E1122 Type 2 diabetes mellitus with diabetic chronic kidney disease: Secondary | ICD-10-CM

## 2019-08-03 DIAGNOSIS — I152 Hypertension secondary to endocrine disorders: Secondary | ICD-10-CM

## 2019-08-06 ENCOUNTER — Ambulatory Visit (INDEPENDENT_AMBULATORY_CARE_PROVIDER_SITE_OTHER): Payer: 59 | Admitting: Family Medicine

## 2019-08-06 ENCOUNTER — Other Ambulatory Visit: Payer: Self-pay

## 2019-08-06 ENCOUNTER — Encounter: Payer: Self-pay | Admitting: Family Medicine

## 2019-08-06 VITALS — Ht 63.5 in | Wt 211.6 lb

## 2019-08-06 DIAGNOSIS — E785 Hyperlipidemia, unspecified: Secondary | ICD-10-CM

## 2019-08-06 DIAGNOSIS — E039 Hypothyroidism, unspecified: Secondary | ICD-10-CM

## 2019-08-06 DIAGNOSIS — E1121 Type 2 diabetes mellitus with diabetic nephropathy: Secondary | ICD-10-CM

## 2019-08-06 DIAGNOSIS — E1169 Type 2 diabetes mellitus with other specified complication: Secondary | ICD-10-CM | POA: Diagnosis not present

## 2019-08-06 DIAGNOSIS — E559 Vitamin D deficiency, unspecified: Secondary | ICD-10-CM

## 2019-08-06 DIAGNOSIS — G4700 Insomnia, unspecified: Secondary | ICD-10-CM

## 2019-08-06 DIAGNOSIS — I152 Hypertension secondary to endocrine disorders: Secondary | ICD-10-CM

## 2019-08-06 DIAGNOSIS — E1329 Other specified diabetes mellitus with other diabetic kidney complication: Secondary | ICD-10-CM

## 2019-08-06 DIAGNOSIS — E1159 Type 2 diabetes mellitus with other circulatory complications: Secondary | ICD-10-CM

## 2019-08-06 DIAGNOSIS — F39 Unspecified mood [affective] disorder: Secondary | ICD-10-CM

## 2019-08-06 DIAGNOSIS — R809 Proteinuria, unspecified: Secondary | ICD-10-CM

## 2019-08-06 DIAGNOSIS — I10 Essential (primary) hypertension: Secondary | ICD-10-CM

## 2019-08-06 MED ORDER — OLMESARTAN MEDOXOMIL 5 MG PO TABS: 10.0000 mg | ORAL_TABLET | Freq: Every day | ORAL | 1 refills | Status: AC

## 2019-08-06 NOTE — Progress Notes (Signed)
Virtual / live video office visit note for [employer], D.O- Primary Care Physician at [facility]    I connected with current patient today and beyond visually recognizing the correct individual, I verified that I am speaking with the correct person using two identifiers.  . Location of the patient: Home . Location of the provider: Office Only the patient (+/- their family members at pt's discretion) and myself were participating in the encounter    - This visit type was conducted due to national recommendations for restrictions regarding the COVID-19 Pandemic (e.g. social distancing) in an effort to limit this patient's exposure and mitigate transmission in our community.  This format is felt to be most appropriate for this patient at this time.   - The patient did have access to video technology today  - No physical exam could be performed with this format, beyond that communicated to [place] by the patient/ family members as noted.   - Additionally my office staff/ schedulers discussed with the patient that there may be a monetary charge related to this service, depending on patient's medical insurance.   The patient expressed understanding, and agreed to proceed.      History of Present Illness:  I, [name], am serving as scribe for Dr. Mellody Dance.    - Health Concerns regarding Bailey Doing a little better; just got the results of [name]'s blood work.  Notes Angie platelets dropped to 37 last week.  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   - Coping with Stress Says she is working on investing Angie emotions as well as she invests Angie money.  Notes that she's working on alternative family living for [name]; says [name] is a source of a lot of stress.  Notes she's switching jobs and thinks this will help.  She has been trying to make life decisions and working with  a counselor in-person once monthly for this.  "She's been trying to make me put myself as a priority without feeling like the guilty Catholic I was raised."   - Eating Habits Doesn't eat eggs, milk, beef, pork, or poultry.  Notes she eats nuts and fish.  Has been trying to eat more fish than nuts since last visit.  Patient is working on losing weight.  Notes Angie goal is to get down to 170 lbs.  Says she used to get on the scale every day and obsess about it.   - Kidney Health Has stopped taking NSAID's entirely.   HPI:  Hypertension:  -  Angie blood pressure at home has been running: 135-140/85-90.  Thinks that she's been very stressed about [name]'s health lately, "and that contributes to it."  Notes she hasn't been able to exercise due to need for Angie upcoming procedure.  She is looking forward to being more mobile.  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   Last 3 blood pressure readings in our office are as follows: BP Readings from Last 3 Encounters:  07/10/19 139/89  04/27/19 136/84  03/31/19 (!) 152/98   Filed Weights   08/06/19 1120  Weight: 211 lb 9.6 oz (96 kg)     HPI:   Diabetes Mellitus:  Home glucose readings:  Fasting sugars in general run between 105-122.  Notes this has been the case for weeks to months.   Says "I can feel how much easier it is to keep my sugar as I lose weight."  - Patient reports good compliance with therapy plan: medication and/or lifestyle modification  - Angie denies acute concerns or problems related to treatment plan  - She denies new  concerns.  Denies polyuria/polydipsia, hypo/ hyperglycemia symptoms.  Denies new onset of: chest pain, exercise intolerance, shortness of breath, dizziness, visual changes, headache, lower extremity swelling or claudication.   Last A1C in the office was:  Lab Results  Component Value Date   HGBA1C 6.5 (H) 07/10/2019   HGBA1C 6.4 (A) 04/27/2019   HGBA1C 7.1 (H) 10/17/2018   Lab Results  Component Value Date   MICROALBUR 30 07/10/2019   LDLCALC 59 07/10/2019   CREATININE 1.13 (H) 07/10/2019   BP Readings from Last 3 Encounters:  07/10/19 139/89  04/27/19 136/84  03/31/19 (!) 152/98   Wt Readings from Last 3 Encounters:  08/06/19 211 lb 9.6 oz (96 kg)  07/10/19 215 lb 12.8 oz (97.9 kg)  04/27/19 230 lb 6.4 oz (104.5 kg)     HPI:  Hyperlipidemia:  58 y.o. female here for cholesterol follow-up.   Notes she hasn't been able to eat most of the "bad stuff" anymore due to Angie egg and milk allergy.  - Patient reports good compliance with treatment plan of:  medication and/ or lifestyle management.    - Patient denies any acute concerns or problems with management plan   - She denies new onset of: myalgias, arthralgias, increased fatigue more than normal, chest pains, exercise intolerance, shortness of breath, dizziness, visual changes, headache, lower extremity swelling or claudication.   Most recent cholesterol panel was:  Lab Results  Component Value Date   CHOL 134 07/10/2019   HDL 48 07/10/2019   LDLCALC 59 07/10/2019   LDLDIRECT 108.0 06/16/2007   TRIG 163 (H) 07/10/2019   CHOLHDL 2.8 07/10/2019   Hepatic Function Latest Ref Rng & Units 07/10/2019 04/27/2019 10/17/2018  Total Protein 6.0 - 8.5 g/dL 7.1 6.9 7.0  Albumin 3.8 - 4.9 g/dL 4.5 4.5 4.1  AST 0 - 40 IU/L _0 ALT 0 - 32 IU/L 42(H) 53(H) 40(H)  Alk Phosphatase 39 - 117 IU/L 82 75 65  Total Bilirubin 0.0 - 1.2 mg/dL 0.4 0.2 0.3  Bilirubin, Direct 0.0 - 0.3 mg/dL - - -     Depression screen [facility] 2/9  08/06/2019 07/10/2019 04/27/2019 11/26/2018 10/21/2018  Decreased Interest 0 0 _1 Down, Depressed, Hopeless 0 0 _2 PHQ - 2 Score 0 0 _3 Altered sleeping _4 Tired, decreased energy _5 Change in appetite _6 Feeling bad or failure about yourself  0 0 0 2 0  Trouble concentrating 0 0 0 2 1  Moving slowly or fidgety/restless 0 0 0 2 0  Suicidal thoughts 0 0 0 0 0  PHQ-9 Score _7 Difficult doing work/chores Somewhat difficult Somewhat difficult - - -    GAD 7 : Generalized Anxiety Score 08/06/2019 04/27/2019  Nervous, Anxious,  on Edge 1 1  Control/stop worrying 3 1  Worry too much - different things 0 0  Trouble relaxing 1 1  Restless 0 0  Easily annoyed or irritable 1 1  Afraid - awful might happen 2 0  Total GAD 7 Score 8 4  Anxiety Difficulty Somewhat difficult -     Impression and Recommendations:    1. Hypertension associated with diabetes ([place])   2. Controlled diabetes mellitus of other type with microalbuminuria, without long-term current use of insulin ([place])   3. Type 2 diabetes mellitus with diabetic nephropathy, unspecified whether long term insulin use ([place])   4. Hyperlipidemia associated with type 2 diabetes mellitus ([place])   5. Morbid obesity ([place])   6. 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   7. Vitamin D insufficiency   8. INSOMNIA, CHRONIC   9. Hypothyroidism, unspecified type     - Last Wellness Exam last obtained 07/10/2019.   - Reviewed recent lab work (07/10/2019) in depth with patient today.  All lab work within normal limits unless otherwise noted.  Extensive education provided and all questions answered.   Elevated ALT, Now Improved - ALT improved to 42 from 53 prior. - Will continue to monitor  Vitamin D Insufficiency - Up to 50.0 from 29.6 prior. - Patient will continue management as established.  See med list. - Will continue to monitor and re-check as recommended.  Type 2 Diabetes Mellitus -  A1c up to 6.5 from 6.4 prior, at goal. - Pt will continue current treatment regimen.  - Counseled patient on pathophysiology of disease and discussed various treatment options, which always includes dietary and lifestyle modification as first line.    - Importance of low carb, heart-healthy diet discussed with patient in addition to regular aerobic exercise of 6mn 5d/week or more.   - Check FBS and 2 hours after the biggest meal of your day.  Keep log and bring in next OV for my review.     - Also told patient if you ever feel poorly, please check your blood pressure and blood sugar, as one or the other could be the cause of your symptoms.  - Pt reminded about need for yearly eye and foot exams.  Told patient to make appt.for diabetic eye exam, CMAs here will do foot exams  - Handouts provided at patient's desire and or told to go online at the American Diabetes Association website for further information  - We will continue to monitor.  Kidney Function - Stage 3 CKD - Serum creatinine elevated, but slightly down to 1.13 from 1.20 prior.  - To help improve kidney function, discussed importance of controlling blood pressure, controlling blood sugar, avoiding use of nephrotoxic substances such as NSAID's, hydrating adequately, and engaging in increased physical activity.  - Will continue to monitor.  Hypertension associated with DM - BP currently suboptimally managed.  - Reviewed goal BP of under 130/80. - Discussed management with patient today. - Olmesartan 10 mg prescribed today.  See med list.  - Counseled patient on pathophysiology of disease and discussed various treatment options, which always includes dietary and lifestyle modification as first line.   - Lifestyle changes such as dash and heart healthy diets and engaging in a regular exercise program discussed extensively with patient.   - Ambulatory blood pressure monitoring encouraged at least 3 times weekly.  Keep log  and bring in every office visit.  Reminded patient that if they ever feel poorly in any way, to  check their blood pressure and pulse.  - Handouts provided at patient's desire and/or told to go online at the [place] website for further information  - We will continue to monitor  Hyperlipidemia associated with DM - Cholesterol levels are at goal on current management.  Triglycerides = 163, down from 179 prior, 212 two years ago. HDL = up to 48 from 37 one year ago. LDL = 59, down from 82 prior.  - Pt will continue current treatment regimen. - Discussed that as patient's weight goes down, Angie dosage may be reduced.  - Dietary changes such as low saturated & trans fat diets for hyperlipidemia and low carb diets for hypertriglyceridemia discussed with patient.    - To help improve HDL, encouraged patient to follow AHA guidelines for regular exercise and also engage in weight loss if BMI above 25.   - Educational handouts provided at patient's desire and/ or told to look online at the [place] website for further information.  - We will continue to monitor  Mood Disorder - Mixed Anxiety & Depression - Encouraged patient to continue to follow up with Angie counselor as established.  - Reviewed the "spokes of the wheel" of mood and health management.  Stressed the importance of ongoing prudent habits, including regular exercise, appropriate sleep hygiene, healthful dietary habits, and prayer/meditation to relax.  - Will continue to monitor.  BMI Counseling - Body mass index is 36.9 kg/m; Morbid Obesity Explained to patient what BMI refers to, and what it means medically.    Told patient to think about it as a "medical risk stratification measurement" and how increasing BMI is associated with increasing risk/ or worsening state of various diseases such as hypertension, hyperlipidemia, diabetes, premature OA, depression etc.  American Heart Association  guidelines for healthy diet, basically Mediterranean diet, and exercise guidelines of 30 minutes 5 days per week or more discussed in detail.  Health counseling performed.  All questions answered.  Lifestyle & Preventative Health Maintenance - Advised patient to continue working toward exercising to improve overall mental, physical, and emotional health.    - Encouraged patient to engage in daily physical activity as tolerated, especially a formal exercise routine.  Recommended that the patient eventually strive for at least 150 minutes of moderate cardiovascular activity per week according to guidelines established by the [facility].   - Healthy dietary habits encouraged, including low-carb, and high amounts of lean protein in diet.   - Patient should also consume adequate amounts of water.  Recommendations - Return in 3-4 months for follow up.   - As part of my medical decision making, I reviewed the following data within the [place] History obtained from pt /family, CMA notes reviewed and incorporated if applicable, Labs reviewed, Radiograph/ tests reviewed if applicable and OV notes from prior OV's with me, as well as other specialists she/he has seen since seeing me last, were all reviewed and used in my medical decision making process today.   - Additionally, discussion had with patient regarding txmnt plan, their biases about that plan etc were used in my medical decision making today.   - The patient agreed with the plan and demonstrated an understanding of the instructions.   No barriers to understanding were identified.   - Red flag symptoms and signs discussed in detail.  Patient expressed understanding regarding what to do in case of emergency\ urgent symptoms.  The patient was advised to call back or seek an in-person  evaluation if the symptoms worsen or if the condition fails to improve as anticipated.   Return for f/up in 3-4 months.     Meds ordered this  encounter  Medications  . olmesartan (BENICAR) 5 MG tablet    Sig: Take 2 tablets (10 mg total) by mouth daily.    Dispense:  180 tablet    Refill:  1    Requesting 1 year supply    Medications Discontinued During This Encounter  Medication Reason  . loratadine (CLARITIN) 10 MG tablet Error  . Vitamin D, Ergocalciferol, (DRISDOL) 1.25 MG (50000 UT) CAPS capsule Error  . olmesartan (BENICAR) 5 MG tablet Reorder      Note:  This note was prepared with assistance of Dragon voice recognition software. Occasional wrong-word or sound-a-like substitutions may have occurred due to the inherent limitations of voice recognition software.   This document serves as a record of services personally performed by Mellody Dance, DO. It was created on Angie behalf by [name], a trained medical scribe. The creation of this record is based on the scribe's personal observations and the provider's statements to them.   This case required medical decision making of at least moderate complexity. The above documentation has been reviewed to be accurate and was completed by Marjory Sneddon, D.O.      Patient Care Team    Relationship Specialty Notifications Start End  Mellody Dance, DO PCP - General Family Medicine  07/07/18   Gatha Mayer, MD Consulting Physician Gastroenterology  07/07/18   Evaristo Bury, MD  Obstetrics and Gynecology  07/07/18    Comment: Uro-gynecologist specialist at [facility], [facility] Bridgeman, [place] Referring Physician Optometry  07/07/18   Wallene Huh, [place] Consulting Physician Podiatry  07/07/18   Delrae Rend, MD Consulting Physician Endocrinology  07/07/18    Comment: For Angie diabetes  Valentina Shaggy, MD Consulting Physician Allergy and Immunology  01/21/19     -Vitals obtained; medications/ allergies reconciled;  personal medical, social, Sx etc.histories were updated by CMA, reviewed by me and are reflected in chart  Patient Active Problem  List   Diagnosis Date Noted  . Type 2 diabetes mellitus with diabetic nephropathy ([place]) 10/02/2017    Priority: High  . Hypertension associated with diabetes ([place]) 09/19/2007    Priority: High  . Hyperlipidemia associated with type 2 diabetes mellitus ([place]) 08/25/2007    Priority: High  . Morbid obesity ([place]) 07/07/2018    Priority: Medium  . Hepatic steatosis 05/08/2018    Priority: Medium  . Melanoma ([place]) 09/17/2013    Priority: Medium  . Anemia 05/26/2007    Priority: Medium  . 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 05/22/2007    Priority: Medium  . History of  ALCOHOL ABUSE 05/22/2007    Priority: Medium  . h/o Seizures ([place]) 05/22/2007    Priority: Medium  . GERD (gastroesophageal reflux disease) 07/07/2018    Priority: Low  . Hypothyroidism 04/01/2013    Priority: Low  . INSOMNIA, CHRONIC 01/02/2010    Priority: Low  . 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 Reactive airway disease 05/22/2007    Priority: Low  . History of anemia 07/10/2019  . Tear of medial meniscus of knee 07/07/2019  . Pain in right knee 06/18/2019  . Knee pain 04/27/2019  . Vitamin D insufficiency 07/10/2018  . Type 2 diabetes mellitus with stage 3 chronic kidney disease, without long-term current use of  insulin ([place]) 07/10/2018  . Dysphagia 07/07/2018  . Hypercalcemia 07/07/2018  . Multinodular goiter 07/07/2018  . Fatty liver 07/07/2018  . Dyslipidemia 07/07/2018  . Essential hypertension 07/07/2018  . Insomnia 07/07/2018  . Chronic pelvic pain in female 07/07/2018  . Chronic bilateral low back pain without sciatica 07/07/2018  . Dyspareunia in female 07/07/2018  . 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 Hashimoto's disease 07/07/2018  . Myalgia 07/07/2018  . Sympathetically maintained pain 07/07/2018  . Status post hysterectomy with oophorectomy:  1998 or so;  non- cancerous reasons 07/07/2018  . AKI (acute kidney injury) ([place]) 05/08/2018  .  Asthma without status asthmaticus 10/02/2017  . HLD (hyperlipidemia) 10/02/2017  . Anxiety disorder 10/02/2017  . Chest pain 10/22/2013  . Abnormal ECG 10/12/2013  . Dysphagia, unspecified(787.20) 04/01/2013  . Thyroid nodule 03/30/2013  . Nocturia 08/06/2011  . Pelvic pain 08/06/2011  . EDEMA 01/02/2010  . SYNCOPE 11/05/2007  . SIALADENITIS 11/03/2007  . ADJUSTMENT DISORDER WITH ANXIETY 10/27/2007  . Benign neoplasm of skin 09/19/2007  . OTHER ABNORMAL GLUCOSE 08/25/2007  . DEPRESSION 05/22/2007  . ALLERGIC RHINITIS 05/22/2007  . Environmental and seasonal allergies 05/22/2007     Current Meds  Medication Sig  . albuterol (VENTOLIN HFA) 108 (90 Base) MCG/ACT inhaler INHALE 1 PUFF BY MOUTH INTO THE LUNGS EVERY 6 HOURS AS  NEEDED FOR WHEEZING  . atorvastatin (LIPITOR) 20 MG tablet Take 1 tablet (20 mg total) by mouth daily.  Marland Kitchen AUVI-Q 0.3 MG/0.3ML SOAJ injection Use for life threatening allergic reactions  . empagliflozin (JARDIANCE) 25 MG TABS tablet Take 1 tablet by mouth daily.  . eszopiclone (LUNESTA) 2 MG TABS tablet TAKE 1 TABLET BY MOUTH AT  BEDTIME AS NEEDED FOR  SLEEP.TAKE IMMEDIATELY  BEFORE BEDTIME AS DIRECTED.  Marland Kitchen FLOVENT HFA 220 MCG/ACT inhaler INHALE 1 PUFF BY MOUTH INTO THE LUNGS TWO TIMES DAILY  . glucose blood (ACCU-CHEK COMPACT PLUS) test strip Use to test BID - fasting in AM and 2 hours after largest meal  . JANUVIA 100 MG tablet TAKE 1 TABLET BY MOUTH  DAILY  . levocetirizine (XYZAL) 5 MG tablet TAKE 1 TABLET BY MOUTH IN  THE EVENING  . levothyroxine (SYNTHROID) 50 MCG tablet Take 1 tablet (50 mcg total) by mouth daily.  . metFORMIN (GLUCOPHAGE) 1000 MG tablet TAKE 1 TABLET BY MOUTH  TWICE DAILY WITH MEALS  . montelukast (SINGULAIR) 10 MG tablet TAKE 1 TABLET BY MOUTH AT  BEDTIME  . olmesartan (BENICAR) 5 MG tablet Take 2 tablets (10 mg total) by mouth daily.  Marland Kitchen venlafaxine XR (EFFEXOR-XR) 75 MG 24 hr capsule TAKE 1 CAPSULE BY MOUTH  DAILY  . [DISCONTINUED]  olmesartan (BENICAR) 5 MG tablet TAKE 1 TABLET BY MOUTH  DAILY     Allergies  Allergen Reactions  . Dulaglutide Other (See Comments) and Diarrhea    Abd pain/Dizziness   . Eggs Or Egg-Derived Products Hives, Swelling and Anaphylaxis  . Lac Bovis Hives, Swelling and Anaphylaxis  . Lexapro [Escitalopram] Other (See Comments)    Causes grand mal seizures; all antidepressants  . Morphine   . Morphine Sulfate Rash     ROS:  See above HPI for pertinent positives and negatives   Objective:   Height 5' 3.5" (1.613 m), weight 211 lb 9.6 oz (96 kg).  (if some vitals are omitted, this means that patient was UNABLE to obtain them even though they were asked to get them prior to OV today.  They were asked to call us  at their earliest convenience with these once obtained.)  General: A & O * 3; visually in no acute distress; in usual state of health.  Skin: Visible skin appears normal and pt's usual skin color HEENT:  EOMI, head is normocephalic and atraumatic.  Sclera are anicteric. Neck has a good range of motion.  Lips are noncyanotic Chest: normal chest excursion and movement Respiratory: speaking in full sentences, no conversational dyspnea; no use of accessory muscles Psych: insight good, mood- appears full

## 2019-08-10 ENCOUNTER — Ambulatory Visit: Payer: 59 | Admitting: Family Medicine

## 2019-08-16 ENCOUNTER — Other Ambulatory Visit: Payer: Self-pay | Admitting: Family Medicine

## 2019-08-16 DIAGNOSIS — E039 Hypothyroidism, unspecified: Secondary | ICD-10-CM

## 2019-09-25 ENCOUNTER — Telehealth: Payer: Self-pay | Admitting: Allergy & Immunology

## 2019-09-25 NOTE — Telephone Encounter (Signed)
Medical records were sent to [facility] per the patient's request on 09/25/19.

## 2019-10-01 ENCOUNTER — Ambulatory Visit: Payer: 59 | Admitting: Allergy & Immunology

## 2020-08-20 IMAGING — CT CT RENAL STONE PROTOCOL
2 of 4 series · 16 of 46 positions shown, 18 images · non-contrast
Comparison: None.

CLINICAL DATA: Pain.  Nausea, vomiting, diarrhea.

EXAM:
CT ABDOMEN AND PELVIS WITHOUT CONTRAST
TECHNIQUE: Multidetector CT imaging of the abdomen and pelvis was performed
following the standard protocol without IV contrast.

[Series 3: renal stone 5.0 · axial · 0.79mm/px · z∈[+713,+1123]mm · 13 of 93 slices shown, 15 images]
[im 7/93  soft-tissue]
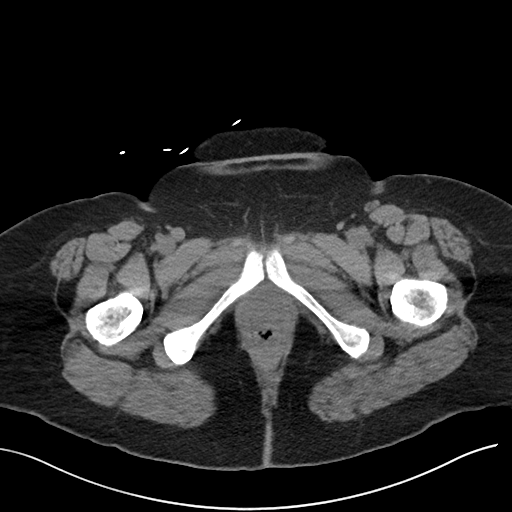
[im 7/93  bone]
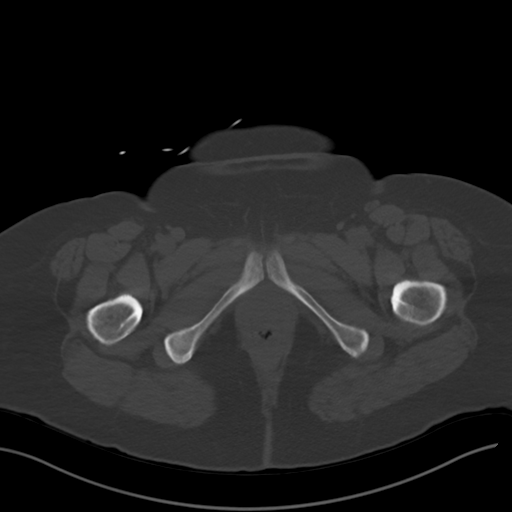
[im 14/93  soft-tissue]
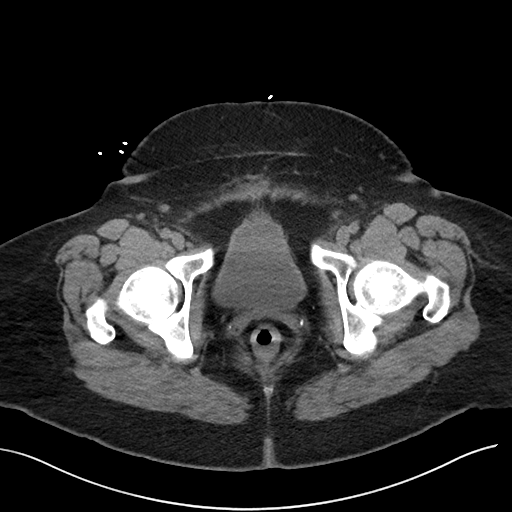
[im 21/93  soft-tissue]
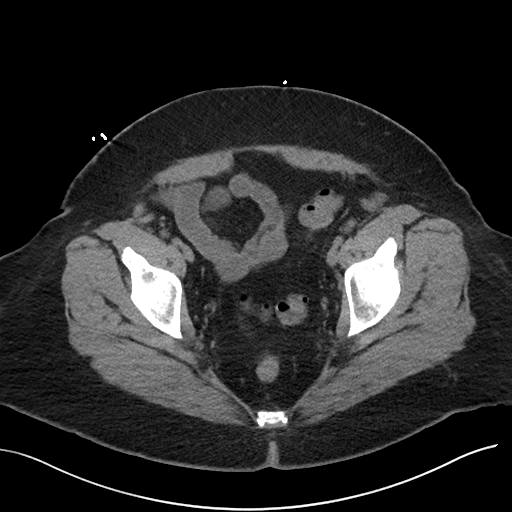
[im 28/93  soft-tissue]
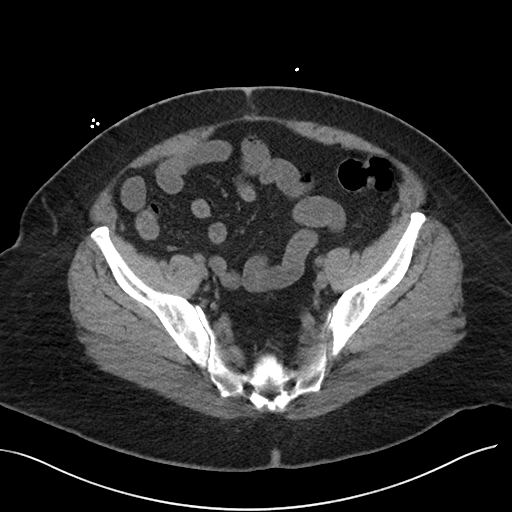
[im 35/93  soft-tissue]
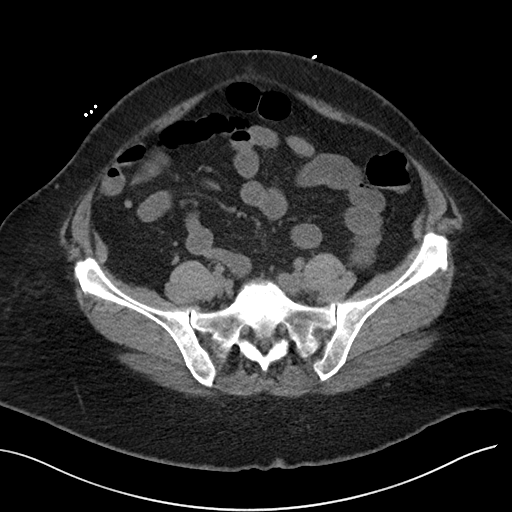
[im 41/93  soft-tissue]
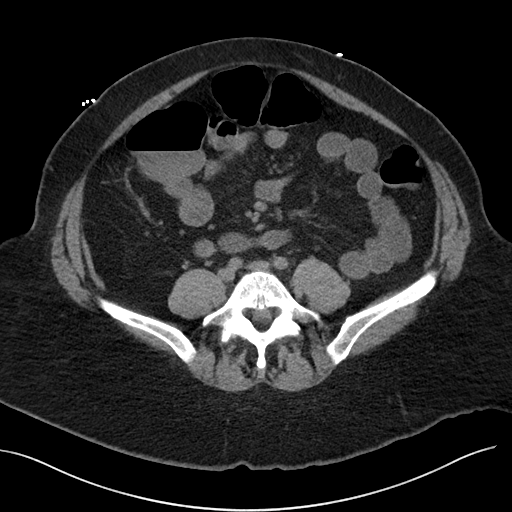
[im 48/93  soft-tissue]
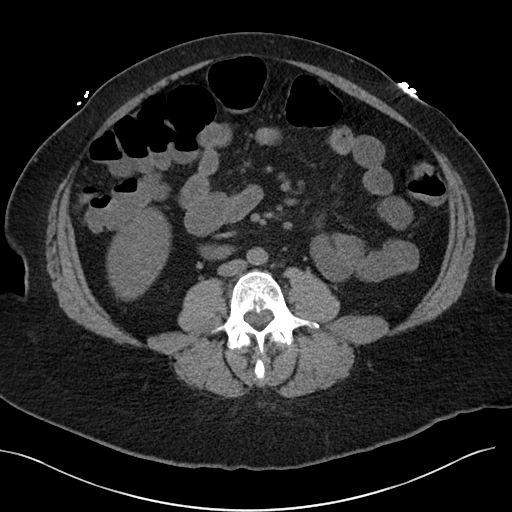
[im 55/93  soft-tissue]
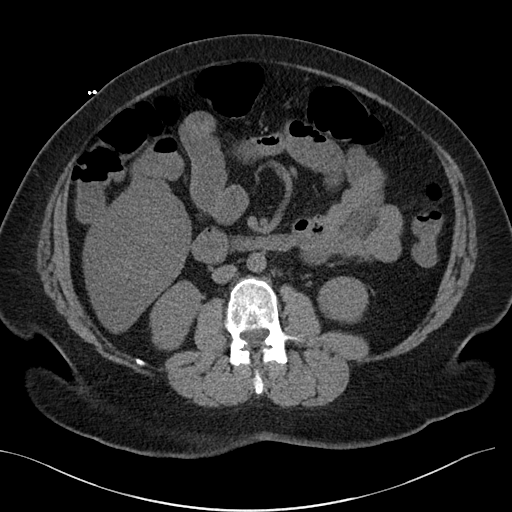
[im 62/93  soft-tissue]
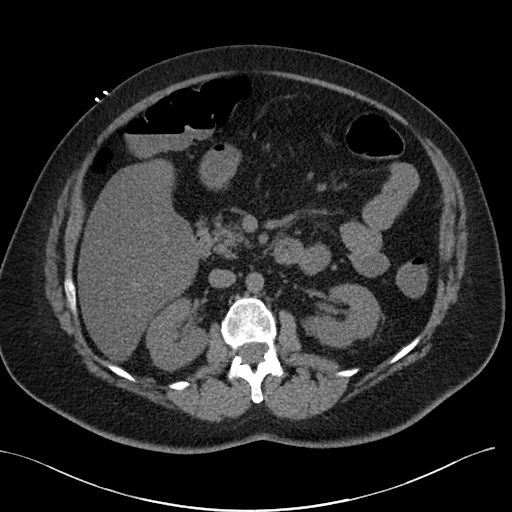
[im 62/93  bone]
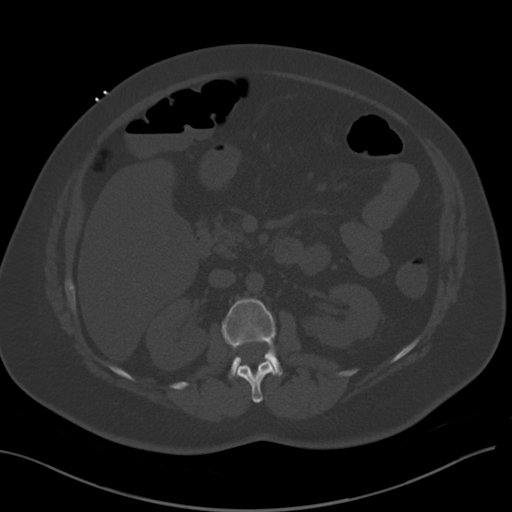
[im 69/93  soft-tissue]
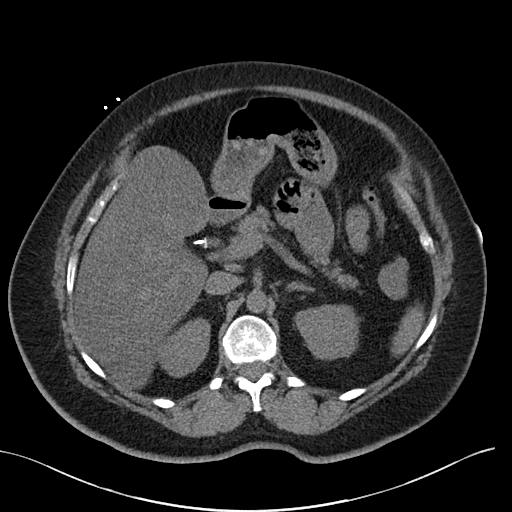
[im 75/93  soft-tissue]
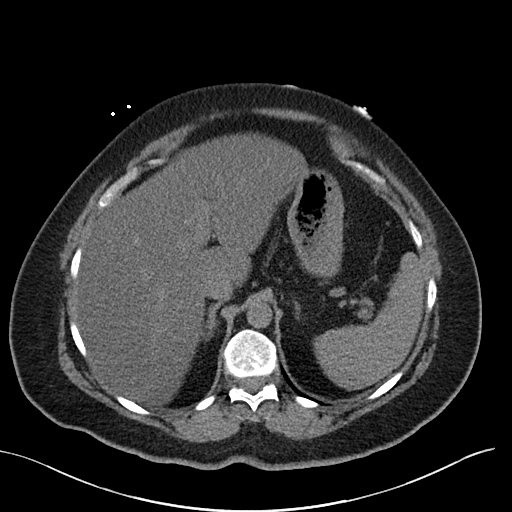
[im 82/93  soft-tissue]
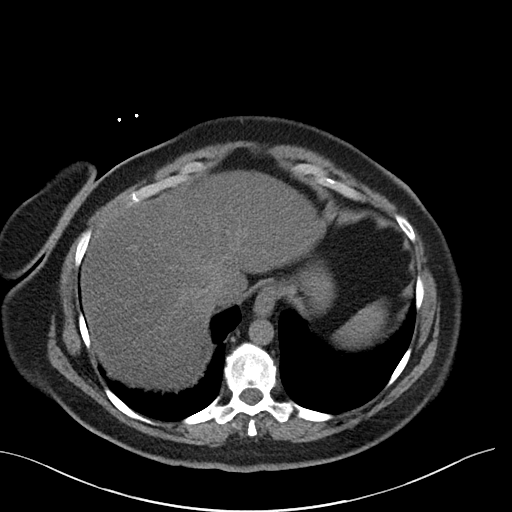
[im 89/93  soft-tissue]
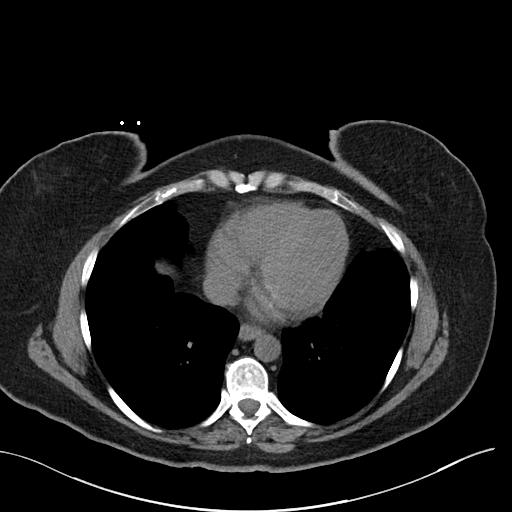

[Series 5: renal stone 3.0 cor · coronal · 0.78mm/px · 3 of 109 slices shown]
[im 37/109  soft-tissue]
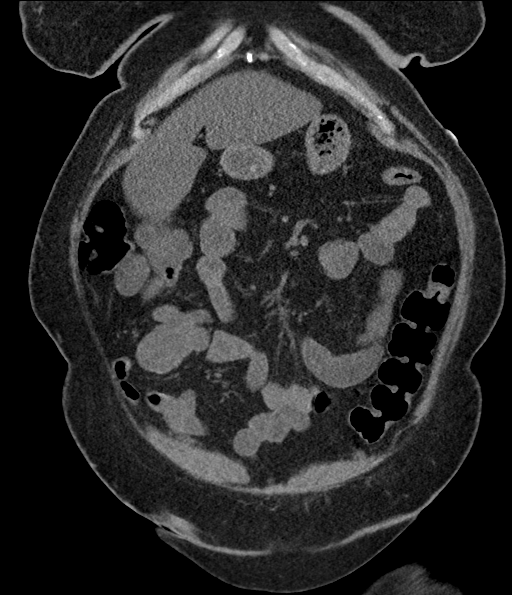
[im 49/109  soft-tissue]
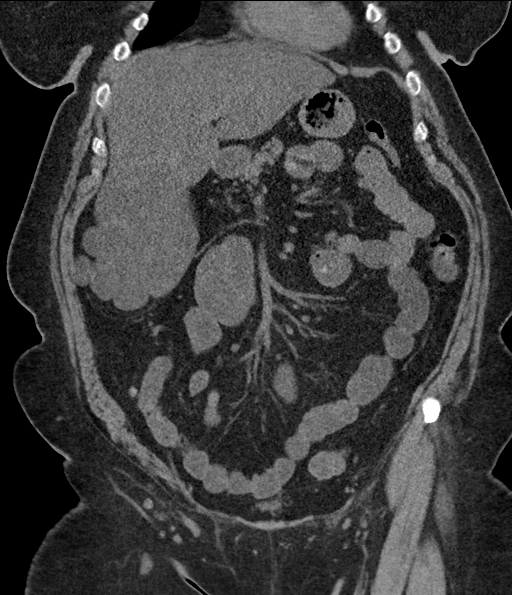
[im 61/109  soft-tissue]
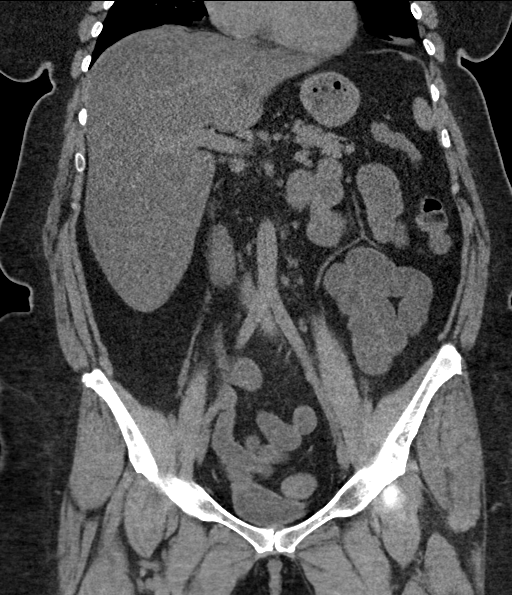

[16 of 46 positions shown; findings below may reference images not displayed]

FINDINGS: Lower chest: Subsegmental right basilar atelectasis.

Hepatobiliary: The liver is enlarged spanning 22 cm cranial caudal
with decreased density consistent with steatosis. No discrete lesion
on noncontrast exam. Clips in the gallbladder fossa
postcholecystectomy. No biliary dilatation.

Pancreas: No ductal dilatation or inflammation.

Spleen: Normal in size without focal abnormality.

Adrenals/Urinary Tract: Normal adrenal glands. No hydronephrosis.
Mild left perinephric edema. No urolithiasis. Urinary bladder is
partially distended, no bladder wall thickening or bladder stone.

Stomach/Bowel: Liquid stool throughout the colon without colonic
wall thickening or inflammatory change. Two ovoid hyperdensities at
the base of the cecum, series 3, image 57, approach the base of the
appendix and may be appendicoliths, enterolith, or ingested pills.
The appendix is normal without periappendiceal inflammation. Small
bowel appears diffusely fluid-filled and prominent without
obstruction or bowel wall thickening. No perienteric inflammatory
change. Stomach is partially distended.

Vascular/Lymphatic: Prominent central mesenteric nodes are likely
reactive. Abdominal aorta is normal in caliber.

Reproductive: Status post hysterectomy. No adnexal masses.

Other: No free air, free fluid, or intra-abdominal fluid collection.
Tiny fat containing umbilical hernia.

Musculoskeletal: Facet arthropathy in the lumbar spine. There are no
acute or suspicious osseous abnormalities.
IMPRESSION: 1. Liquid stool throughout the colon, as well as fluid-filled small
bowel consistent with diarrheal process and possible enteritis. No
bowel wall thickening or perienteric inflammation. No obstruction.
2. Mild left perinephric edema without urolithiasis, can be seen
with urinary tract infection or recently passed stone.
3. Hepatomegaly and hepatic steatosis.
4. Hyperdensities at the base of the cecum may be ingested pills,
anterolisthesis, or appendicoliths. Appendix is otherwise normal.

## 2020-12-27 ENCOUNTER — Ambulatory Visit (INDEPENDENT_AMBULATORY_CARE_PROVIDER_SITE_OTHER): Payer: No Typology Code available for payment source | Admitting: Psychologist

## 2020-12-27 DIAGNOSIS — F330 Major depressive disorder, recurrent, mild: Secondary | ICD-10-CM | POA: Diagnosis not present

## 2020-12-27 DIAGNOSIS — F411 Generalized anxiety disorder: Secondary | ICD-10-CM | POA: Diagnosis not present

## 2021-01-11 ENCOUNTER — Ambulatory Visit (INDEPENDENT_AMBULATORY_CARE_PROVIDER_SITE_OTHER): Payer: No Typology Code available for payment source | Admitting: Psychologist

## 2021-01-11 DIAGNOSIS — F330 Major depressive disorder, recurrent, mild: Secondary | ICD-10-CM

## 2021-01-11 DIAGNOSIS — F411 Generalized anxiety disorder: Secondary | ICD-10-CM

## 2021-02-01 ENCOUNTER — Ambulatory Visit (INDEPENDENT_AMBULATORY_CARE_PROVIDER_SITE_OTHER): Payer: No Typology Code available for payment source | Admitting: Psychologist

## 2021-02-01 DIAGNOSIS — F411 Generalized anxiety disorder: Secondary | ICD-10-CM

## 2021-02-01 DIAGNOSIS — F330 Major depressive disorder, recurrent, mild: Secondary | ICD-10-CM

## 2021-02-17 ENCOUNTER — Ambulatory Visit (INDEPENDENT_AMBULATORY_CARE_PROVIDER_SITE_OTHER): Payer: No Typology Code available for payment source | Admitting: Psychologist

## 2021-02-17 DIAGNOSIS — F411 Generalized anxiety disorder: Secondary | ICD-10-CM | POA: Diagnosis not present

## 2021-02-17 DIAGNOSIS — F330 Major depressive disorder, recurrent, mild: Secondary | ICD-10-CM

## 2021-03-17 ENCOUNTER — Ambulatory Visit (INDEPENDENT_AMBULATORY_CARE_PROVIDER_SITE_OTHER): Payer: No Typology Code available for payment source | Admitting: Psychologist

## 2021-03-17 DIAGNOSIS — F330 Major depressive disorder, recurrent, mild: Secondary | ICD-10-CM | POA: Diagnosis not present

## 2021-03-17 DIAGNOSIS — F411 Generalized anxiety disorder: Secondary | ICD-10-CM

## 2021-04-06 ENCOUNTER — Ambulatory Visit: Payer: No Typology Code available for payment source | Admitting: Psychologist

## 2021-04-13 ENCOUNTER — Ambulatory Visit (INDEPENDENT_AMBULATORY_CARE_PROVIDER_SITE_OTHER): Payer: No Typology Code available for payment source | Admitting: Psychologist

## 2021-04-13 DIAGNOSIS — F411 Generalized anxiety disorder: Secondary | ICD-10-CM | POA: Diagnosis not present

## 2021-04-13 DIAGNOSIS — F330 Major depressive disorder, recurrent, mild: Secondary | ICD-10-CM

## 2021-05-18 ENCOUNTER — Ambulatory Visit: Payer: No Typology Code available for payment source | Admitting: Psychologist

## 2021-05-31 ENCOUNTER — Ambulatory Visit (INDEPENDENT_AMBULATORY_CARE_PROVIDER_SITE_OTHER): Payer: No Typology Code available for payment source | Admitting: Psychologist

## 2021-05-31 DIAGNOSIS — F330 Major depressive disorder, recurrent, mild: Secondary | ICD-10-CM

## 2021-05-31 DIAGNOSIS — F411 Generalized anxiety disorder: Secondary | ICD-10-CM | POA: Diagnosis not present

## 2021-06-14 ENCOUNTER — Ambulatory Visit (INDEPENDENT_AMBULATORY_CARE_PROVIDER_SITE_OTHER): Payer: No Typology Code available for payment source | Admitting: Psychologist

## 2021-06-14 DIAGNOSIS — F330 Major depressive disorder, recurrent, mild: Secondary | ICD-10-CM | POA: Diagnosis not present

## 2021-06-14 DIAGNOSIS — F411 Generalized anxiety disorder: Secondary | ICD-10-CM | POA: Diagnosis not present

## 2021-07-13 ENCOUNTER — Ambulatory Visit (INDEPENDENT_AMBULATORY_CARE_PROVIDER_SITE_OTHER): Payer: No Typology Code available for payment source | Admitting: Psychologist

## 2021-07-13 DIAGNOSIS — F330 Major depressive disorder, recurrent, mild: Secondary | ICD-10-CM | POA: Diagnosis not present

## 2021-07-13 DIAGNOSIS — F411 Generalized anxiety disorder: Secondary | ICD-10-CM

## 2021-08-03 ENCOUNTER — Ambulatory Visit (INDEPENDENT_AMBULATORY_CARE_PROVIDER_SITE_OTHER): Payer: No Typology Code available for payment source | Admitting: Psychologist

## 2021-08-03 DIAGNOSIS — F411 Generalized anxiety disorder: Secondary | ICD-10-CM

## 2021-08-03 DIAGNOSIS — F330 Major depressive disorder, recurrent, mild: Secondary | ICD-10-CM | POA: Diagnosis not present

## 2021-09-07 ENCOUNTER — Ambulatory Visit (INDEPENDENT_AMBULATORY_CARE_PROVIDER_SITE_OTHER): Payer: No Typology Code available for payment source | Admitting: Psychologist

## 2021-09-07 DIAGNOSIS — F330 Major depressive disorder, recurrent, mild: Secondary | ICD-10-CM | POA: Diagnosis not present

## 2021-09-07 DIAGNOSIS — F411 Generalized anxiety disorder: Secondary | ICD-10-CM | POA: Diagnosis not present

## 2021-09-07 NOTE — Progress Notes (Signed)
[facility] Counselor/Therapist Progress Note  Patient ID: [name], MRN: [id number],    Date: 09/07/2021  Time Spent: 3:02 pm to 3:44 pm; Total Time: 42 minutes   This session was held via video webex teletherapy due to the coronavirus risk at this time. The patient consented to video teletherapy and was located at her home during this session. She is aware it is the responsibility of the patient to secure confidentiality on her end of the session. The provider was in a private home office for the duration of this session. Limits of confidentiality were discussed with the patient.   Treatment Type: Individual Therapy  Reported Symptoms: Anxiety and depressive symptoms.   Mental Status Exam: Appearance:  Well Groomed     Behavior: Appropriate  Motor: Normal  Speech/Language:  Normal Rate  Affect: Appropriate  Mood: normal  Thought process: normal  Thought content:   WNL  Sensory/Perceptual disturbances:   WNL  Orientation: oriented to person, place, time/date, and situation  Attention: Good  Concentration: Good  Memory: WNL  Fund of knowledge:  Good  Insight:   Good  Judgment:  Fair  Impulse Control: Good   Risk Assessment: Danger to Self:  No Self-injurious Behavior: No Danger to Others: No Duty to Warn:no Physical Aggression / Violence:No  Access to Firearms a concern: No  Gang Involvement:No   Subjective: Beginning the session, patient disclosed that her surgery was delayed due to experiencing bronchitis. From there, she spent the rest of the session reflecting on the theme of apologizing consistently. She identified the etiology of this need. From there, she explored the idea of control and areas in her life that she has allowed herself to give up control. She identified characteristics in others that assist her in feeling comfortable in giving up control. She processed thoughts and emotions behind these themes. She was agreeable to homework and  following up. She denied suicidal and homicidal ideation.    Interventions:  Worked on developing a therapeutic relationship with the patient using active listening and reflective statements. Provided emotional support using empathy and validation. Reviewed events since the last session. Processed emotions related to the delay in the surgery. Used socratic questions to assist the patient gain insight into self. Challenged some of the thoughts expressed. Identified several different themes and processed the emotions of those themes. Explored the etiology of needing to apologize. Used metaphors to assist the patient gain insight into self. Assisted the patient gain insight into self. Explored how patient has given up control and how she can apply it to other realms of her life. Processed expressed thoughts and emotions. Provided empathic statements. Assigned homework. Assessed for suicidal and homicidal ideation.   Homework: Write story of control and talk with boss about accountability  Next Session: Review homework and emotional support.   Diagnosis: F33.0 major depressive affective disorder, recurrent, mild and F41.1 generalized anxiety disorder   Plan:   Client Abilities: Patient is friendly and easy to develop rapport  Client Preferences: Patient voiced interest in ACT, TLDP, and CBT  Client statement of Needs: Coping skills, emotional support, and work through understanding how past impacts today  Treatment Level: Goals Alleviate depressive symptoms Recognize, accept, and cope with depressive feelings Develop healthy thinking patterns Develop healthy interpersonal relationships Reduce overall frequency, intensity, and duration of anxiety Stabilize anxiety level wile increasing ability to function Enhance ability to effectively cope with full variety of stressors Learn and implement coping skills that result in a reduction of  anxiety   Objectives: Target Date for all objectives  12/27/2021 Verbalize an understanding of the cognitive, physiological, and behavioral components of anxiety Learning and implement calming skills to reduce overall anxiety Verbalize an understanding of the role that cognitive biases play in excessive irrational worry and persistent anxiety symptoms Identify, challenge, and replace based fearful talk Learn and implement problem solving strategies Identify and engage in pleasant activities Learning and implement personal and interpersonal skills to reduce anxiety and improve interpersonal relationships Learn to accept limitations in life and commit to tolerating, rather than avoiding, unpleasant emotions while accomplishing meaningful goals Identify major life conflicts from the past and present that form the basis for present anxiety Maintain involvement in work, family, and social activities Reestablish a consistent sleep-wake cycle Cooperate with a medical evaluation  Cooperate with a medication evaluation by a physician Verbalize an accurate understanding of depression Verbalize an understanding of the treatment Identify and replace thoughts that support depression Learn and implement behavioral strategies Verbalize an understanding and resolution of current interpersonal problems Learn and implement problem solving and decision making skills Learn and implement conflict resolution skills to resolve interpersonal problems Verbalize an understanding of healthy and unhealthy emotions verbalize insight into how past relationships may be influence current experiences with depression Use mindfulness and acceptance strategies and increase value based behavior  Increase hopeful statements about the future.  Interventions Engage the patient in behavioral activation Use instruction, modeling, and role-playing to build the client's general social, communication, and/or conflict resolution skills Use Acceptance and Commitment Therapy to help client  accept uncomfortable realities in order to accomplish value-consistent goals Reinforce the client's insight into the role of his/her past emotional pain and present anxiety  Support the client in following through with work, family, and social activities Teach and implement sleep hygiene practices  Refer the patient to a physician for a psychotropic medication consultation Monito the clint's psychotropic medication compliance Discuss how anxiety typically involves excessive worry, various bodily expressions of tension, and avoidance of what is threatening that interact to maintain the problem  Teach the patient relaxation skills Assign the patient homework Discuss examples demonstrating that unrealistic worry overestimates the probability of threats and underestimates patient's ability  Assist the patient in analyzing his or her worries Help patient understand that avoidance is reinforcing  Consistent with treatment model, discuss how change in cognitive, behavioral, and interpersonal can help client alleviate depression CBT Behavioral activation help the client explore the relationship, nature of the dispute,  Help the client develop new interpersonal skills and relationships Conduct Problem so living therapy Teach conflict resolution skills Use a process-experiential approach Conduct TLDP Conduct ACT Evaluate need for psychotropic medication Monitor adherence to medication   The patient reviewed the treatment plan on 4.27.2022. The patient approved of the treatment plan.   Conception Chancy, PsyD

## 2021-10-12 ENCOUNTER — Ambulatory Visit: Payer: No Typology Code available for payment source | Admitting: Psychologist

## 2024-07-05 ENCOUNTER — Other Ambulatory Visit: Payer: Self-pay | Admitting: Medical Genetics

## 2024-09-22 ENCOUNTER — Other Ambulatory Visit: Payer: Self-pay

## 2024-10-07 ENCOUNTER — Other Ambulatory Visit (HOSPITAL_COMMUNITY)
Admission: RE | Admit: 2024-10-07 | Discharge: 2024-10-07 | Disposition: A | Discharge status: Home / Self Care | Payer: Self-pay | Source: Ambulatory Visit | Attending: Medical Genetics | Admitting: Medical Genetics

## 2024-10-18 LAB — GENECONNECT MOLECULAR SCREEN: Genetic Analysis Overall Interpretation: NEGATIVE
